# Patient Record
Sex: Male | Born: 1980 | Race: Black or African American | Hispanic: No | Marital: Single | Smoking: Never smoker
Health system: Southern US, Community
[De-identification: ages and names within clinical notes are randomized; demographics above are authoritative.]

## PROBLEM LIST (undated history)

## (undated) DIAGNOSIS — Z87442 Personal history of urinary calculi: Secondary | ICD-10-CM

## (undated) DIAGNOSIS — G4733 Obstructive sleep apnea (adult) (pediatric): Secondary | ICD-10-CM

## (undated) DIAGNOSIS — K219 Gastro-esophageal reflux disease without esophagitis: Secondary | ICD-10-CM

## (undated) DIAGNOSIS — D869 Sarcoidosis, unspecified: Secondary | ICD-10-CM

## (undated) DIAGNOSIS — E785 Hyperlipidemia, unspecified: Secondary | ICD-10-CM

## (undated) DIAGNOSIS — R030 Elevated blood-pressure reading, without diagnosis of hypertension: Secondary | ICD-10-CM

## (undated) DIAGNOSIS — J324 Chronic pansinusitis: Secondary | ICD-10-CM

## (undated) DIAGNOSIS — J849 Interstitial pulmonary disease, unspecified: Secondary | ICD-10-CM

## (undated) DIAGNOSIS — R7303 Prediabetes: Secondary | ICD-10-CM

## (undated) DIAGNOSIS — E790 Hyperuricemia without signs of inflammatory arthritis and tophaceous disease: Secondary | ICD-10-CM

## (undated) DIAGNOSIS — J309 Allergic rhinitis, unspecified: Secondary | ICD-10-CM

## (undated) DIAGNOSIS — R011 Cardiac murmur, unspecified: Secondary | ICD-10-CM

## (undated) DIAGNOSIS — R590 Localized enlarged lymph nodes: Secondary | ICD-10-CM

## (undated) DIAGNOSIS — R079 Chest pain, unspecified: Secondary | ICD-10-CM

## (undated) HISTORY — DX: Interstitial pulmonary disease, unspecified: J84.9

## (undated) HISTORY — PX: LUNG BIOPSY: SHX232

## (undated) HISTORY — DX: Hyperlipidemia, unspecified: E78.5

## (undated) HISTORY — DX: Chronic pansinusitis: J32.4

## (undated) HISTORY — DX: Elevated blood-pressure reading, without diagnosis of hypertension: R03.0

## (undated) HISTORY — DX: Hyperuricemia without signs of inflammatory arthritis and tophaceous disease: E79.0

## (undated) HISTORY — DX: Sarcoidosis, unspecified: D86.9

## (undated) HISTORY — DX: Localized enlarged lymph nodes: R59.0

## (undated) HISTORY — DX: Morbid (severe) obesity due to excess calories: E66.01

## (undated) HISTORY — PX: NO PAST SURGERIES: SHX2092

## (undated) HISTORY — DX: Obstructive sleep apnea (adult) (pediatric): G47.33

## (undated) HISTORY — DX: Allergic rhinitis, unspecified: J30.9

## (undated) HISTORY — DX: Prediabetes: R73.03

## (undated) HISTORY — DX: Chest pain, unspecified: R07.9

---

## 2017-09-18 ENCOUNTER — Emergency Department (HOSPITAL_COMMUNITY)
Admission: EM | Admit: 2017-09-18 | Discharge: 2017-09-18 | Disposition: A | Discharge status: Home / Self Care | Payer: Self-pay | Attending: Emergency Medicine | Admitting: Emergency Medicine

## 2017-09-18 ENCOUNTER — Other Ambulatory Visit: Payer: Self-pay

## 2017-09-18 ENCOUNTER — Encounter (HOSPITAL_COMMUNITY): Payer: Self-pay

## 2017-09-18 ENCOUNTER — Emergency Department (HOSPITAL_COMMUNITY): Payer: Self-pay

## 2017-09-18 DIAGNOSIS — B9789 Other viral agents as the cause of diseases classified elsewhere: Secondary | ICD-10-CM

## 2017-09-18 DIAGNOSIS — J069 Acute upper respiratory infection, unspecified: Secondary | ICD-10-CM | POA: Insufficient documentation

## 2017-09-18 MED ORDER — FLUTICASONE PROPIONATE 50 MCG/ACT NA SUSP: 2.0000 | Freq: Every day | NASAL | 0 refills | Status: DC

## 2017-09-18 MED ORDER — LORATADINE 10 MG PO TABS: 10.0000 mg | ORAL_TABLET | Freq: Every day | ORAL | 0 refills | Status: DC

## 2017-09-18 MED ORDER — ALBUTEROL SULFATE HFA 108 (90 BASE) MCG/ACT IN AERS
2.0000 | INHALATION_SPRAY | Freq: Once | RESPIRATORY_TRACT | Status: AC
  Administered 2017-09-18: 2 via RESPIRATORY_TRACT
  Filled 2017-09-18: qty 6.7

## 2017-09-18 NOTE — ED Provider Notes (Signed)
[facility] EMERGENCY DEPARTMENT Provider Note   CSN: [id number] Arrival date & time: 09/18/17  1024     History   Chief Complaint Chief Complaint  Patient presents with  . Cough    HPI Paul Thomas is a 37 y.o. male.  HPI   Pt is a 37 y.o male who presents to the ED today c/o a cough that has been ongoing for about 2 months. Cough has been productive of yellow sputum.  He reports nasal congestion and post nasal drip. Denies rhinorrhea or ear pain.  He reports intermittent sore throat, denies current sore throat. He denies fevers, chest pain, shortness of breath. States that in the morning he has hoarse voice intermittently over the last few months as well which improves after he coughs up sputum. Has tried taking mucinex and claritin without relief. Denies tobacco use.   History reviewed. No pertinent past medical history.  There are no active problems to display for this patient.   History reviewed. No pertinent surgical history.      Home Medications    Prior to Admission medications   Medication Sig Start Date End Date Taking? Authorizing Provider  fluticasone (FLONASE) 50 MCG/ACT nasal spray Place 2 sprays into both nostrils daily. 09/18/17   Donnavan Covault S, PA-C  loratadine (CLARITIN) 10 MG tablet Take 1 tablet (10 mg total) by mouth daily. 09/18/17   Verlyn Lambert S, PA-C    Family History History reviewed. No pertinent family history.  Social History Social History   Tobacco Use  . Smoking status: Never Smoker  Substance Use Topics  . Alcohol use: Yes    Comment: occ  . Drug use: Never     Allergies   Patient has no known allergies.   Review of Systems Review of Systems  Constitutional: Negative for fever.  HENT: Positive for congestion, postnasal drip, sinus pressure and voice change. Negative for ear pain, rhinorrhea, sinus pain, sore throat and trouble swallowing.   Eyes: Negative for visual disturbance.    Respiratory: Positive for cough. Negative for shortness of breath and wheezing.   Cardiovascular: Negative for chest pain.  Gastrointestinal: Negative for abdominal pain, diarrhea, nausea and vomiting.  Genitourinary: Negative for decreased urine volume and frequency.  Musculoskeletal: Negative for back pain and neck pain.  Skin: Negative for rash.  Neurological: Negative for headaches.     Physical Exam Updated Vital Signs BP 135/89   Pulse 78   Temp 98.3 F (36.8 C) (Oral)   Resp 16   Ht 6\' 1"  (1.854 m)   Wt (!) 147.4 kg (325 lb)   SpO2 98%   BMI 42.88 kg/m   Physical Exam  Constitutional: He appears well-developed and well-nourished.  HENT:  Head: Normocephalic and atraumatic.  Right Ear: External ear normal.  Left Ear: External ear normal.  Mouth/Throat: Oropharynx is clear and moist.  Bilateral TMs without erythema or effusion.  Uvula midline.  No pharyngeal erythema or tonsillar swelling.  No tonsillar exudates.  Patient has mildly hoarse voice, however no hot potato voice.  No evidence of PTA or retropharyngeal abscess.  Eyes: Pupils are equal, round, and reactive to light. Conjunctivae and EOM are normal.  Neck: Normal range of motion. Neck supple.  Cardiovascular: Normal rate, regular rhythm, normal heart sounds and intact distal pulses.  No murmur heard. Pulmonary/Chest: Effort normal and breath sounds normal. No stridor. No respiratory distress. He has no wheezes.  Abdominal: Soft. Bowel sounds are normal. There is no  tenderness.  Musculoskeletal: He exhibits no edema.  Lymphadenopathy:    He has no cervical adenopathy.  Neurological: He is alert.  Skin: Skin is warm and dry.  Psychiatric: He has a normal mood and affect.  Nursing note and vitals reviewed.    ED Treatments / Results  Labs (all labs ordered are listed, but only abnormal results are displayed) Labs Reviewed - No data to display  EKG None  Radiology Dg Chest 2 View  Result Date:  09/18/2017 CLINICAL DATA:  Intermittent cough over the last 2 months. EXAM: CHEST - 2 VIEW COMPARISON:  None. FINDINGS: Borderline cardiomegaly. Question hilar and paratracheal prominence that could go along with adenopathy. Centrally prominent lung markings which could indicate bronchitis or other processes such as sarcoid lung disease. No effusions. Bony structures unremarkable. IMPRESSION: Central bronchial thickening. Possible hilar and mediastinal adenopathy. The differential diagnosis is inflammatory disease versus sarcoid. Electronically Signed   By: Paulina FusiMark  Shogry M.D.   On: 09/18/2017 11:31    Procedures Procedures (including critical care time)  Medications Ordered in ED Medications  albuterol (PROVENTIL HFA;VENTOLIN HFA) 108 (90 Base) MCG/ACT inhaler 2 puff (2 puffs Inhalation Given 09/18/17 1251)     Initial Impression / Assessment and Plan / ED Course  I have reviewed the triage vital signs and the nursing notes.  Pertinent labs & imaging results that were available during my care of the patient were reviewed by me and considered in my medical decision making (see chart for details).      Final Clinical Impressions(s) / ED Diagnoses   Final diagnoses:  Viral URI with cough   Pt CXR negative for acute infiltrate. Patients symptoms are consistent with viral URI vs seasonal allergies. Exam nonconcerning for PTA or retropharyngeal abscess. Discussed that antibiotics are not indicated for viral infections. Pt will be discharged with symptomatic treatment. Discussed results of cxr and that pt needs to follow up with pulmonology for further evaluation. Pt hemodynamically stable. Verbalizes understanding and is agreeable with plan. Pt is hemodynamically stable & in NAD prior to dc.   ED Discharge Orders        Ordered    fluticasone (FLONASE) 50 MCG/ACT nasal spray  Daily     09/18/17 1239    loratadine (CLARITIN) 10 MG tablet  Daily     09/18/17 1239       Lorea Kupfer S,  PA-C 09/18/17 1254    Margarita Grizzleay, Danielle, MD 09/18/17 1708

## 2017-09-18 NOTE — ED Notes (Signed)
Patient returned from xray.

## 2017-09-18 NOTE — Discharge Instructions (Signed)
You were given a prescription for Claritin, Flonase, and an albuterol inhaler for your symptoms.  Make sure that you are staying well-hydrated over the next few weeks.  You should follow-up with the pulmonologist for further treatment of your symptoms.  You should return to the emergency department for any fevers, shortness of breath, chest pain, or any new or worsening symptoms.

## 2017-09-18 NOTE — ED Notes (Signed)
Patient transported to X-ray 

## 2017-09-18 NOTE — ED Triage Notes (Signed)
Pt endorses intermittent cough/hoarseness x 2 months, denies fever shob or cp. Occasional green sputum. VSS

## 2017-09-25 ENCOUNTER — Encounter: Payer: Self-pay | Admitting: Pulmonary Disease

## 2017-09-25 ENCOUNTER — Ambulatory Visit (INDEPENDENT_AMBULATORY_CARE_PROVIDER_SITE_OTHER): Payer: Self-pay | Admitting: Pulmonary Disease

## 2017-09-25 VITALS — BP 130/84 | HR 74 | Wt 321.0 lb

## 2017-09-25 DIAGNOSIS — R05 Cough: Secondary | ICD-10-CM

## 2017-09-25 DIAGNOSIS — R059 Cough, unspecified: Secondary | ICD-10-CM

## 2017-09-25 DIAGNOSIS — K219 Gastro-esophageal reflux disease without esophagitis: Secondary | ICD-10-CM

## 2017-09-25 DIAGNOSIS — R590 Localized enlarged lymph nodes: Secondary | ICD-10-CM

## 2017-09-25 DIAGNOSIS — J301 Allergic rhinitis due to pollen: Secondary | ICD-10-CM

## 2017-09-25 MED ORDER — DOXYCYCLINE HYCLATE 100 MG PO TABS: 100.0000 mg | ORAL_TABLET | Freq: Two times a day (BID) | ORAL | 0 refills | Status: DC

## 2017-09-25 NOTE — Patient Instructions (Signed)
Abnormal chest x-ray with lymph node enlargement: We will arrange for a CT scan of your chest If the CT scan shows abnormalities then we will likely need to perform a bronchoscopy and will call you to arrange this.  Allergic rhinitis: Take Claritin daily Take Flonase 2 sprays each nostril daily  Gastroesophageal reflux disease: Take over-the-counter Prilosec daily for the next month  Acute bronchitis: Take the doxycycline 100 mg twice a day times 5 days  Follow up in 3-4 weeks or sooner if needed

## 2017-09-25 NOTE — Progress Notes (Signed)
Subjective:   PATIENT ID: Paul Thomas GENDER: male DOB: [birth year], MRN: [id number]  Synopsis: Referred in March 2019 for evaluation of an abnormal CXR that showed lymph node involvement  HPI  Chief Complaint  Patient presents with  . Hospitalization Follow-up    produtive cough with brown mucous, dx with bronchitit    Mr. Paul Thomas is a pleasant 37 year old male with no past medical history who is never smoked cigarettes who comes to my clinic today for evaluation of an abnormal chest x-ray and bronchitis.  He says it for the last 2 months he has been coughing up yellow to brown mucus and has hoarseness.  This is also associated with a scratchy sensation in his throat.  He has sinus congestion and postnasal drip which he only recently started treating with Claritin and Flonase.  He says he also has acid reflux and he only takes Prilosec on an as-needed basis.  He denies any shortness of breath fevers chills or weight loss.  He says that his symptoms have been persistent for the last 2 months and he continues to cough up brown mucus now.  He was seen in the emergency room a week ago and was told that he had an abnormal chest x-ray so a referral was made to our clinic for further evaluation.  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  He has no childhood history of respiratory illnesses and no family history of lung disease.  History reviewed. No pertinent past medical history.   Family History  Problem Relation Age of Onset  . Heart attack Father      Social History   Socioeconomic History  . Marital status: Single    Spouse name: Not on file  . Number of children: Not on file  . Years of education: Not on file  . Highest education level: Not on file  Occupational History  . Not on file  Social Needs  . Financial resource strain: Not on file  . Food insecurity:    Worry: Not on file    Inability:  Not on file  . Transportation needs:    Medical: Not on file    Non-medical: Not on file  Tobacco Use  . Smoking status: Never Smoker  Substance and Sexual Activity  . Alcohol use: Not Currently    Frequency: Never    Comment: occ  . Drug use: Never  . Sexual activity: Not on file  Lifestyle  . Physical activity:    Days per week: Not on file    Minutes per session: Not on file  . Stress: Not on file  Relationships  . Social connections:    Talks on phone: Not on file    Gets together: Not on file    Attends religious service: Not on file    Active member of club or organization: Not on file    Attends meetings of clubs or organizations: Not on file    Relationship status: Not on file  . Intimate partner violence:    Fear of current or ex partner: Not on file    Emotionally abused: Not on file    Physically abused: Not on file    Forced sexual activity: Not on file  Other Topics Concern  . Not on file  Social History Narrative  . Not on file     No Known Allergies  Outpatient Medications Prior to Visit  Medication Sig Dispense Refill  . fluticasone (FLONASE) 50 MCG/ACT nasal spray Place 2 sprays into both nostrils daily. 16 Thomas 0  . loratadine (CLARITIN) 10 MG tablet Take 1 tablet (10 mg total) by mouth daily. 20 tablet 0   No facility-administered medications prior to visit.     Review of Systems  Constitutional: Negative for chills, fever, malaise/fatigue and weight loss.  HENT: Positive for congestion. Negative for nosebleeds, sinus pain and sore throat.   Eyes: Negative for photophobia, pain and discharge.  Respiratory: Positive for cough. Negative for hemoptysis, sputum production, shortness of breath and wheezing.   Cardiovascular: Negative for chest pain, palpitations, orthopnea and leg swelling.  Gastrointestinal: Negative for abdominal pain, constipation, diarrhea, nausea and vomiting.  Genitourinary: Negative for dysuria, frequency, hematuria and urgency.   Musculoskeletal: Negative for back pain, joint pain, myalgias and neck pain.  Skin: Negative for itching and rash.  Neurological: Negative for tingling, tremors, sensory change, speech change, focal weakness, seizures, weakness and headaches.  Psychiatric/Behavioral: Negative for memory loss, substance abuse and suicidal ideas. The patient is not nervous/anxious.       Objective:  Physical Exam   Vitals:   09/25/17 1034  BP: 130/84  Pulse: 74  SpO2: 97%  Weight: (!) 321 lb (145.6 kg)    Gen: well appearing, no acute distress HENT: NCAT, OP clear, neck supple without masses Eyes: PERRL, EOMi Lymph: no cervical lymphadenopathy PULM: Few crackles lower lobes B CV: RRR, no mgr, no JVD GI: BS+, soft, nontender, no hsm Derm: no rash or skin breakdown MSK: normal bulk and tone Neuro: A&Ox4, CN II-XII intact, strength 5/5 in all 4 extremities Psyche: normal mood and affect  CBC No results found for: WBC, RBC, HGB, HCT, PLT, MCV, MCH, MCHC, RDW, LYMPHSABS, MONOABS, EOSABS, BASOSABS   Chest imaging:  March 2019 chest x-ray images independently reviewed showing lymphadenopathy bilaterally  PFT:  Labs:  Path:  Echo:  Heart Catheterization:  Records from his ER visit were reviewed where he was seen for cough.     Assessment & Plan:   Cough  Localized enlarged lymph nodes - Plan: CT Chest Wo Contrast  Allergic rhinitis due to pollen, unspecified seasonality  Gastroesophageal reflux disease, esophagitis presence not specified  Discussion: Mr. Paul Thomas presents with cough and mucus production for 2 months.  He has poorly controlled allergic rhinitis as well as acid reflux.  While these are likely the cause of his cough he was found incidentally to have lymph node enlargement on his chest x-ray.  I explained to him today that the differential diagnosis here is broad and includes atypical infection, sarcoidosis, less likely lymphoma.  He does not have any signs or  symptoms of an active malignancy.  However, we cannot ignore the findings on the chest x-ray so we need to assess further with a CT scan and then likely biopsy.  I explained all this to him today at length.  Plan: Abnormal chest x-ray with lymph node enlargement: We will arrange for a CT scan of your chest If the CT scan shows abnormalities then we will likely need to perform a bronchoscopy and will call you to arrange this.  Allergic rhinitis: Take Claritin daily Take Flonase 2 sprays each nostril daily  Gastroesophageal reflux disease: Take over-the-counter Prilosec daily for the next month  Acute bronchitis: Take the doxycycline 100 mg twice a day times 5 days  Follow up in 3-4 weeks or sooner if needed  Current Outpatient Medications:  .  doxycycline (VIBRA-TABS) 100 MG tablet, Take 1 tablet (100 mg total) by mouth 2 (two) times daily., Disp: 10 tablet, Rfl: 0 .  fluticasone (FLONASE) 50 MCG/ACT nasal spray, Place 2 sprays into both nostrils daily., Disp: 16 Thomas, Rfl: 0 .  loratadine (CLARITIN) 10 MG tablet, Take 1 tablet (10 mg total) by mouth daily., Disp: 20 tablet, Rfl: 0

## 2017-10-02 ENCOUNTER — Ambulatory Visit (INDEPENDENT_AMBULATORY_CARE_PROVIDER_SITE_OTHER)
Admission: RE | Admit: 2017-10-02 | Discharge: 2017-10-02 | Disposition: A | Discharge status: Home / Self Care | Payer: Self-pay | Source: Ambulatory Visit | Attending: Pulmonary Disease | Admitting: Pulmonary Disease

## 2017-10-02 DIAGNOSIS — R590 Localized enlarged lymph nodes: Secondary | ICD-10-CM

## 2017-10-09 ENCOUNTER — Telehealth: Payer: Self-pay | Admitting: Pulmonary Disease

## 2017-10-09 DIAGNOSIS — R599 Enlarged lymph nodes, unspecified: Secondary | ICD-10-CM

## 2017-10-09 NOTE — Telephone Encounter (Signed)
Pt called back. Wants to know about a possible procedure to be sch.

## 2017-10-09 NOTE — Telephone Encounter (Signed)
Called patient, unable to reach left message to give us a call back. 

## 2017-10-09 NOTE — Telephone Encounter (Signed)
Called and spoke with pt who wanted to know if the bronch had been scheduled for pt.  Listed in pt's CT Super D Chest without contrast, BQ stated for pt to have an EBUS and per Morrie SheldonAshley, [facility].  Stated to pt [facility] would [facility] to see if she has an update on us for when EBUS could be scheduled.

## 2017-10-10 ENCOUNTER — Telehealth: Payer: Self-pay | Admitting: Pulmonary Disease

## 2017-10-10 MED ORDER — DOXYCYCLINE HYCLATE 100 MG PO TABS: 100.0000 mg | ORAL_TABLET | Freq: Two times a day (BID) | ORAL | 0 refills | Status: DC

## 2017-10-10 NOTE — Telephone Encounter (Signed)
Doxycycline 100mg po bid x 5 days 

## 2017-10-10 NOTE — Telephone Encounter (Signed)
Paul Thomas please follow up on this, thank you.

## 2017-10-10 NOTE — Telephone Encounter (Signed)
Spoke with pt. States that his cough has returned since stopping Doxycycline. Reports that the cough is non productive. Denies chest tightness, wheezing, SOB or fever. Pt is scheduled for a bronch on 10/23/17. Pt would like a refill on Doxycycline.  Dr. Kendrick FriesMcQuaid - please advise. Thanks.

## 2017-10-10 NOTE — Telephone Encounter (Signed)
Spoke with pt. He is aware of Dr. McQuaid's recommendation. Rx has been sent in. Nothing further was needed. 

## 2017-10-10 NOTE — Addendum Note (Signed)
Addended by: Velvet BatheAULFIELD, ASHLEY L on: 10/10/2017 10:03 AM   Modules accepted: Orders

## 2017-10-10 NOTE — Telephone Encounter (Signed)
I need an order and some dates dr Kendrick Friesmcquaid might could do this

## 2017-10-10 NOTE — Telephone Encounter (Signed)
BQ is available on the following dates:   Tuesday, Wed, or [place] [facility] in the afternoon 4/30, 5/1, 5/2. ------ Order placed.  Routing back to [facility] for scheduling.

## 2017-10-11 ENCOUNTER — Other Ambulatory Visit: Payer: Self-pay

## 2017-10-11 ENCOUNTER — Encounter (HOSPITAL_COMMUNITY)
Admission: RE | Admit: 2017-10-11 | Discharge: 2017-10-11 | Disposition: A | Discharge status: Home / Self Care | Payer: Self-pay | Source: Ambulatory Visit | Attending: Pulmonary Disease | Admitting: Pulmonary Disease

## 2017-10-11 ENCOUNTER — Encounter (HOSPITAL_COMMUNITY): Payer: Self-pay

## 2017-10-11 ENCOUNTER — Telehealth: Payer: Self-pay | Admitting: Pulmonary Disease

## 2017-10-11 DIAGNOSIS — R590 Localized enlarged lymph nodes: Secondary | ICD-10-CM | POA: Insufficient documentation

## 2017-10-11 DIAGNOSIS — Z01812 Encounter for preprocedural laboratory examination: Secondary | ICD-10-CM | POA: Insufficient documentation

## 2017-10-11 HISTORY — DX: Personal history of urinary calculi: Z87.442

## 2017-10-11 HISTORY — DX: Cardiac murmur, unspecified: R01.1

## 2017-10-11 HISTORY — DX: Gastro-esophageal reflux disease without esophagitis: K21.9

## 2017-10-11 LAB — COMPREHENSIVE METABOLIC PANEL
ALT: 46 U/L (ref 17–63)
AST: 40 U/L (ref 15–41)
Albumin: 3.5 g/dL (ref 3.5–5.0)
Alkaline Phosphatase: 97 U/L (ref 38–126)
Anion gap: 11 (ref 5–15)
BILIRUBIN TOTAL: 0.7 mg/dL (ref 0.3–1.2)
BUN: 11 mg/dL (ref 6–20)
CO2: 24 mmol/L (ref 22–32)
Calcium: 8.9 mg/dL (ref 8.9–10.3)
Chloride: 100 mmol/L — ABNORMAL LOW (ref 101–111)
Creatinine, Ser: 1.11 mg/dL (ref 0.61–1.24)
GFR calc Af Amer: >60 mL/min (ref >60)
Glucose, Bld: 117 mg/dL — ABNORMAL HIGH (ref 65–99)
POTASSIUM: 3.8 mmol/L (ref 3.5–5.1)
Sodium: 135 mmol/L (ref 135–145)
TOTAL PROTEIN: 8.5 g/dL — AB (ref 6.5–8.1)

## 2017-10-11 LAB — PROTIME-INR
INR: 1.08
Prothrombin Time: 13.9 seconds (ref 11.4–15.2)

## 2017-10-11 LAB — CBC
HEMATOCRIT: 41.1 % (ref 39.0–52.0)
HEMOGLOBIN: 13.3 g/dL (ref 13.0–17.0)
MCH: 26.9 pg (ref 26.0–34.0)
MCHC: 32.4 g/dL (ref 30.0–36.0)
MCV: 83 fL (ref 78.0–100.0)
Platelets: 358 10*3/uL (ref 150–400)
RBC: 4.95 MIL/uL (ref 4.22–5.81)
RDW: 14.7 % (ref 11.5–15.5)
WBC: 5.1 10*3/uL (ref 4.0–10.5)

## 2017-10-11 LAB — APTT: APTT: 31 s (ref 24–36)

## 2017-10-11 NOTE — Telephone Encounter (Signed)
Called and spoke with patient. He stated that he had googled the procedure and wanted to know how invasive the procedure would be. I explained the steps of the procedure and patient stated he felt better. Nothing further needed.

## 2017-10-11 NOTE — Pre-Procedure Instructions (Signed)
Paul Thomas  10/11/2017      [id number]- [place], [place] - [address] [place] [id number] Phone: [phone] Fax: [phone]    Your procedure is scheduled on  Tuesday 10/23/17  [facility] Admitting at [employer]1130 A.M.  Call this number if you have problems the morning of surgery:  [phone]   Remember:  Do not eat food or drink liquids after midnight.  Take these medicines the morning of surgery with A SIP OF WATER - flonase, omeprazole (prilosec)  7 days prior to surgery STOP taking any Aspirin(unless otherwise instructed by your surgeon), Aleve, Naproxen, Ibuprofen, Motrin, Advil, Goody's, BC's, all herbal medications, fish oil, and all vitamins    Do not wear jewelry, make-up or nail polish.  Do not wear lotions, powders, or perfumes, or deodorant.  Do not shave 48 hours prior to surgery.  Men may shave face and neck.  Do not bring valuables to the hospital.  [facility] is not responsible for any belongings or valuables.  Contacts, dentures or bridgework may not be worn into surgery.  Leave your suitcase in the car.  After surgery it may be brought to your room.  For patients admitted to the hospital, discharge time will be determined by your treatment team.  Patients discharged the day of surgery will not be allowed to drive home.   Name and phone number of your driver:    Special instructions:  [name] - Preparing for Surgery  Before surgery, you can play an important role.  Because skin is not sterile, your skin needs to be as free of germs as possible.  You can reduce the number of germs on you skin by washing with CHG (chlorahexidine gluconate) soap before surgery.  CHG is an antiseptic cleaner which kills germs and bonds with the skin to continue killing germs even after washing.  Please DO NOT use if you have an allergy to CHG or antibacterial soaps.  If your skin becomes  reddened/irritated stop using the CHG and inform your nurse when you arrive at Short Stay.  Do not shave (including legs and underarms) for at least 48 hours prior to the first CHG shower.  You may shave your face.  Please follow these instructions carefully:   1.  Shower with CHG Soap the night before surgery and the                                morning of Surgery.  2.  If you choose to wash your hair, wash your hair first as usual with your       normal shampoo.  3.  After you shampoo, rinse your hair and body thoroughly to remove the                      Shampoo.  4.  Use CHG as you would any other liquid soap.  You can apply chg directly       to the skin and wash gently with scrungie or a clean washcloth.  5.  Apply the CHG Soap to your body ONLY FROM THE NECK DOWN.        Do not use on open wounds or open sores.  Avoid contact with your eyes,       ears, mouth and genitals (private parts).  Wash genitals (private parts)  with your normal soap.  6.  Wash thoroughly, paying special attention to the area where your surgery        will be performed.  7.  Thoroughly rinse your body with warm water from the neck down.  8.  DO NOT shower/wash with your normal soap after using and rinsing off       the CHG Soap.  9.  Pat yourself dry with a clean towel.            10.  Wear clean pajamas.            11.  Place clean sheets on your bed the night of your first shower and do not        sleep with pets.  Day of Surgery  Do not apply any lotions/deoderants the morning of surgery.  Please wear clean clothes to the hospital/surgery center.     Please read over the following fact sheets that you were given. Pain Booklet

## 2017-10-11 NOTE — Progress Notes (Signed)
   10/11/17 0825  OBSTRUCTIVE SLEEP APNEA  Have you ever been diagnosed with sleep apnea through a sleep study? No  Do you snore loudly (loud enough to be heard through closed doors)?  1  Do you often feel tired, fatigued, or sleepy during the daytime (such as falling asleep during driving or talking to someone)? 0  Has anyone observed you stop breathing during your sleep? 1  Do you have, or are you being treated for high blood pressure? 0  BMI more than 35 kg/m2? 1  Age > 50 (1-yes) 0  Neck circumference greater than:Male 16 inches or larger, Male 17inches or larger? 1  Male Gender (Yes=1) 1  Obstructive Sleep Apnea Score 5  Score 5 or greater  Results sent to PCP

## 2017-10-23 ENCOUNTER — Ambulatory Visit (HOSPITAL_COMMUNITY): Payer: Self-pay | Admitting: Vascular Surgery

## 2017-10-23 ENCOUNTER — Encounter (HOSPITAL_COMMUNITY)
Admission: RE | Disposition: A | Discharge status: Home / Self Care | Payer: Self-pay | Source: Ambulatory Visit | Attending: Pulmonary Disease

## 2017-10-23 ENCOUNTER — Encounter (HOSPITAL_COMMUNITY): Payer: Self-pay | Admitting: *Deleted

## 2017-10-23 ENCOUNTER — Ambulatory Visit (HOSPITAL_COMMUNITY): Payer: Self-pay | Admitting: Certified Registered"

## 2017-10-23 ENCOUNTER — Ambulatory Visit: Payer: Self-pay | Admitting: Adult Health

## 2017-10-23 ENCOUNTER — Ambulatory Visit (HOSPITAL_COMMUNITY)
Admission: RE | Admit: 2017-10-23 | Discharge: 2017-10-23 | Disposition: A | Discharge status: Home / Self Care | Payer: Self-pay | Source: Ambulatory Visit | Attending: Pulmonary Disease | Admitting: Pulmonary Disease

## 2017-10-23 ENCOUNTER — Ambulatory Visit (HOSPITAL_COMMUNITY): Payer: Self-pay

## 2017-10-23 DIAGNOSIS — R590 Localized enlarged lymph nodes: Secondary | ICD-10-CM

## 2017-10-23 DIAGNOSIS — Z79899 Other long term (current) drug therapy: Secondary | ICD-10-CM | POA: Insufficient documentation

## 2017-10-23 DIAGNOSIS — Z9889 Other specified postprocedural states: Secondary | ICD-10-CM

## 2017-10-23 DIAGNOSIS — Z8249 Family history of ischemic heart disease and other diseases of the circulatory system: Secondary | ICD-10-CM | POA: Insufficient documentation

## 2017-10-23 DIAGNOSIS — Z419 Encounter for procedure for purposes other than remedying health state, unspecified: Secondary | ICD-10-CM

## 2017-10-23 DIAGNOSIS — J8410 Pulmonary fibrosis, unspecified: Secondary | ICD-10-CM | POA: Insufficient documentation

## 2017-10-23 DIAGNOSIS — J849 Interstitial pulmonary disease, unspecified: Secondary | ICD-10-CM

## 2017-10-23 DIAGNOSIS — K219 Gastro-esophageal reflux disease without esophagitis: Secondary | ICD-10-CM | POA: Insufficient documentation

## 2017-10-23 HISTORY — PX: VIDEO BRONCHOSCOPY WITH ENDOBRONCHIAL ULTRASOUND: SHX6177

## 2017-10-23 LAB — BODY FLUID CELL COUNT WITH DIFFERENTIAL
EOS FL: 1 %
Lymphs, Fluid: 26 %
Monocyte-Macrophage-Serous Fluid: 4 % — ABNORMAL LOW (ref 50–90)
Neutrophil Count, Fluid: 69 % — ABNORMAL HIGH (ref 0–25)
WBC FLUID: 114 uL (ref 0–1000)

## 2017-10-23 SURGERY — BRONCHOSCOPY, WITH EBUS
Anesthesia: General | Site: Chest

## 2017-10-23 MED ORDER — FENTANYL CITRATE (PF) 250 MCG/5ML IJ SOLN
INTRAMUSCULAR | Status: DC | PRN
  Administered 2017-10-23: 150 ug via INTRAVENOUS
  Administered 2017-10-23: 50 ug via INTRAVENOUS

## 2017-10-23 MED ORDER — PHENYLEPHRINE 40 MCG/ML (10ML) SYRINGE FOR IV PUSH (FOR BLOOD PRESSURE SUPPORT)
PREFILLED_SYRINGE | INTRAVENOUS | Status: DC | PRN
  Administered 2017-10-23 (×3): 80 ug via INTRAVENOUS

## 2017-10-23 MED ORDER — SUCCINYLCHOLINE CHLORIDE 200 MG/10ML IV SOSY
PREFILLED_SYRINGE | INTRAVENOUS | Status: DC | PRN
  Administered 2017-10-23: 120 mg via INTRAVENOUS

## 2017-10-23 MED ORDER — GLYCOPYRROLATE 0.2 MG/ML IV SOSY
PREFILLED_SYRINGE | INTRAVENOUS | Status: DC | PRN
  Administered 2017-10-23: .2 mg via INTRAVENOUS

## 2017-10-23 MED ORDER — FENTANYL CITRATE (PF) 250 MCG/5ML IJ SOLN
INTRAMUSCULAR | Status: AC
  Filled 2017-10-23: qty 5

## 2017-10-23 MED ORDER — PROPOFOL 10 MG/ML IV BOLUS
INTRAVENOUS | Status: DC | PRN
  Administered 2017-10-23: 40 mg via INTRAVENOUS
  Administered 2017-10-23: 200 mg via INTRAVENOUS

## 2017-10-23 MED ORDER — PROPOFOL 500 MG/50ML IV EMUL
INTRAVENOUS | Status: DC | PRN
  Administered 2017-10-23: 200 ug/kg/min via INTRAVENOUS
  Administered 2017-10-23: 14:00:00 via INTRAVENOUS

## 2017-10-23 MED ORDER — MIDAZOLAM HCL 2 MG/2ML IJ SOLN
INTRAMUSCULAR | Status: AC
  Filled 2017-10-23: qty 2

## 2017-10-23 MED ORDER — 0.9 % SODIUM CHLORIDE (POUR BTL) OPTIME
TOPICAL | Status: DC | PRN
  Administered 2017-10-23: 1000 mL

## 2017-10-23 MED ORDER — SUGAMMADEX SODIUM 200 MG/2ML IV SOLN
INTRAVENOUS | Status: DC | PRN
  Administered 2017-10-23: 300 mg via INTRAVENOUS

## 2017-10-23 MED ORDER — MIDAZOLAM HCL 5 MG/5ML IJ SOLN
INTRAMUSCULAR | Status: DC | PRN
  Administered 2017-10-23: 2 mg via INTRAVENOUS

## 2017-10-23 MED ORDER — LACTATED RINGERS IV SOLN
INTRAVENOUS | Status: DC
  Administered 2017-10-23 (×2): via INTRAVENOUS

## 2017-10-23 MED ORDER — DEXMEDETOMIDINE HCL IN NACL 200 MCG/50ML IV SOLN
INTRAVENOUS | Status: DC | PRN
  Administered 2017-10-23: 20 ug via INTRAVENOUS

## 2017-10-23 MED ORDER — DEXAMETHASONE SODIUM PHOSPHATE 10 MG/ML IJ SOLN
INTRAMUSCULAR | Status: DC | PRN
  Administered 2017-10-23: 10 mg via INTRAVENOUS

## 2017-10-23 MED ORDER — LIDOCAINE 2% (20 MG/ML) 5 ML SYRINGE
INTRAMUSCULAR | Status: DC | PRN
  Administered 2017-10-23: 100 mg via INTRAVENOUS

## 2017-10-23 MED ORDER — ALBUTEROL SULFATE HFA 108 (90 BASE) MCG/ACT IN AERS
INHALATION_SPRAY | RESPIRATORY_TRACT | Status: DC | PRN
  Administered 2017-10-23: 8 via RESPIRATORY_TRACT

## 2017-10-23 MED ORDER — ROCURONIUM BROMIDE 10 MG/ML (PF) SYRINGE
PREFILLED_SYRINGE | INTRAVENOUS | Status: DC | PRN
  Administered 2017-10-23 (×2): 10 mg via INTRAVENOUS
  Administered 2017-10-23: 50 mg via INTRAVENOUS

## 2017-10-23 MED ORDER — ONDANSETRON HCL 4 MG/2ML IJ SOLN
INTRAMUSCULAR | Status: DC | PRN
  Administered 2017-10-23: 4 mg via INTRAVENOUS

## 2017-10-23 SURGICAL SUPPLY — 25 items
BRUSH CYTOL CELLEBRITY 1.5X140 (MISCELLANEOUS) IMPLANT
CANISTER SUCT 3000ML PPV (MISCELLANEOUS) ×3 IMPLANT
CONT SPEC 4OZ CLIKSEAL STRL BL (MISCELLANEOUS) ×15 IMPLANT
COVER BACK TABLE 60X90IN (DRAPES) ×3 IMPLANT
COVER DOME SNAP 22 D (MISCELLANEOUS) ×3 IMPLANT
FORCEPS BIOP RJ4 1.8 (CUTTING FORCEPS) ×3 IMPLANT
GAUZE SPONGE 4X4 12PLY STRL (GAUZE/BANDAGES/DRESSINGS) ×3 IMPLANT
GLOVE BIO SURGEON STRL SZ8 (GLOVE) ×3 IMPLANT
GOWN STRL REUS W/ TWL LRG LVL3 (GOWN DISPOSABLE) ×1 IMPLANT
GOWN STRL REUS W/TWL LRG LVL3 (GOWN DISPOSABLE) ×2
KIT CLEAN ENDO COMPLIANCE (KITS) ×6 IMPLANT
KIT TURNOVER KIT B (KITS) ×3 IMPLANT
MARKER SKIN DUAL TIP RULER LAB (MISCELLANEOUS) ×3 IMPLANT
NEEDLE EBUS SONO TIP PENTAX (NEEDLE) ×3 IMPLANT
NS IRRIG 1000ML POUR BTL (IV SOLUTION) ×3 IMPLANT
OIL SILICONE PENTAX (PARTS (SERVICE/REPAIRS)) ×3 IMPLANT
PAD ARMBOARD 7.5X6 YLW CONV (MISCELLANEOUS) ×6 IMPLANT
SYR 20CC LL (SYRINGE) ×3 IMPLANT
SYR 20ML ECCENTRIC (SYRINGE) ×6 IMPLANT
SYR 5ML LUER SLIP (SYRINGE) ×3 IMPLANT
TOWEL OR 17X24 6PK STRL BLUE (TOWEL DISPOSABLE) ×3 IMPLANT
TRAP SPECIMEN MUCOUS 40CC (MISCELLANEOUS) IMPLANT
TUBE CONNECTING 20'X1/4 (TUBING) ×1
TUBE CONNECTING 20X1/4 (TUBING) ×2 IMPLANT
VALVE DISPOSABLE (MISCELLANEOUS) ×6 IMPLANT

## 2017-10-23 NOTE — Op Note (Signed)
Video Bronchoscopy with Endobronchial Ultrasound Procedure Note  Date of Operation: 10/23/2017  Pre-op Diagnosis: Mediastinal lymphadenopathy, diffuse parenchymal lung disease  Post-op Diagnosis: Mediastinal lymphadenopathy, diffuse parenchymal lung disease  Surgeon: Roselie Awkward   Assistants: none  Anesthesia: General endotracheal anesthesia  Operation: Flexible video fiberoptic bronchoscopy with endobronchial ultrasound and biopsies.  Estimated Blood Loss: Minimal  Complications: none immediate  Indications and History: Paul Thomas is a 37 y.o. male with a recent case of bronchitis, he had a CXR performed that showed mediastinal lymphadenopathy.  He came today for a bronchoscopy with biopsy.  The risks, benefits, complications, treatment options and expected outcomes were discussed with the patient.  The possibilities of pneumothorax, pneumonia, reaction to medication, pulmonary aspiration, perforation of a viscus, bleeding, failure to diagnose a condition and creating a complication requiring transfusion or operation were discussed with the patient who freely signed the consent.    Description of Procedure: The patient was examined in the preoperative area and history and data from the preprocedure consultation were reviewed. It was deemed appropriate to proceed.  The patient was taken to OR 10, identified as [name] and the procedure verified as Flexible Video Fiberoptic Bronchoscopy.  A Time Out was held and the above information confirmed. After being taken to the operating room general anesthesia was initiated and the patient  was orally intubated. The video fiberoptic bronchoscope was introduced via the endotracheal tube and a general inspection was performed which showed nodularity throughout the mucosa.  A BAL was performed in the right middle lobe. The standard scope was then withdrawn and the endobronchial ultrasound was used to identify and characterize the  peritracheal, hilar and bronchial lymph nodes. Inspection showed enlarge lymph nodes in nearly every station visible. Using real-time ultrasound guidance Wang needle biopsies were take from Station 10L and 7 nodes and were sent for cytology.  At this point the EBUS scope was removed and the video bronchoscope was re-inserted and transbronchial biopsies were taken from the RUL anterior segment and RLL anterior segments, 4 biopsies from each site.  Then 5 endobronchial biopsies were taken from the subcarina in the RLL. The patient tolerated the procedure well without apparent complications. There was no significant blood loss. The bronchoscope was withdrawn. Anesthesia was reversed and the patient was taken to the PACU for recovery.   Samples: 1. Wang needle biopsies from 10L node 2. Wang needle biopsies from 7 node 3. Transbronchial biopsies from the right upper lobe and right lower lobe, these were submitted as one specimen labeled right upper lobe. 4. Endobronchial biopsies from the right lower lobe. 5. Bronchoalveolar lavage from the right middle lobe: sent for cytology, cell count with diff, bacterial, fungus, AFB culture.  Plans:  The patient will be discharged from the PACU to home when recovered from anesthesia. We will review the cytology, pathology and microbiology results with the patient when they become available. Outpatient followup will be with Dr. Lake Bells.    Roselie Awkward, MD [facility] Pager: [phone] Cell: [phone] After 3pm or if no response, call [phone]  10/23/2017_0 :51 PM

## 2017-10-23 NOTE — Anesthesia Postprocedure Evaluation (Signed)
Anesthesia Post Note  Patient: Paul Thomas  Procedure(s) Performed: VIDEO BRONCHOSCOPY WITH ENDOBRONCHIAL ULTRASOUND (N/A Chest)     Patient location during evaluation: PACU Anesthesia Type: General Level of consciousness: awake Pain management: pain level controlled Vital Signs Assessment: post-procedure vital signs reviewed and stable Respiratory status: spontaneous breathing Cardiovascular status: stable Anesthetic complications: no    Last Vitals:  Vitals:   10/23/17 1522 10/23/17 1530  BP: 114/65   Pulse:  77  Resp:  20  Temp:    SpO2:  93%    Last Pain:  Vitals:   10/23/17 1530  TempSrc:   PainSc: 0-No pain                 Melynda Krzywicki

## 2017-10-23 NOTE — Anesthesia Procedure Notes (Signed)
Procedure Name: Intubation Date/Time: 10/23/2017 1:43 PM Performed by: Elliot Dally, CRNA Pre-anesthesia Checklist: Patient identified, Emergency Drugs available, Suction available and Patient being monitored Patient Re-evaluated:Patient Re-evaluated prior to induction Oxygen Delivery Method: Circle System Utilized Preoxygenation: Pre-oxygenation with 100% oxygen Induction Type: IV induction Ventilation: Oral airway inserted - appropriate to patient size, Two handed mask ventilation required and Mask ventilation without difficulty Laryngoscope Size: Miller and 3 Grade View: Grade I Tube type: Oral Tube size: 8.0 mm Number of attempts: 1 Airway Equipment and Method: Stylet and Oral airway Placement Confirmation: ETT inserted through vocal cords under direct vision,  positive ETCO2 and breath sounds checked- equal and bilateral Secured at: 23 cm Tube secured with: Tape Dental Injury: Teeth and Oropharynx as per pre-operative assessment

## 2017-10-23 NOTE — Anesthesia Preprocedure Evaluation (Signed)
Anesthesia Evaluation  Patient identified by MRN, date of birth, ID band Patient awake    Reviewed: Allergy & Precautions, NPO status , Patient's Chart, lab work & pertinent test results  Airway Mallampati: II  TM Distance: >3 FB     Dental   Pulmonary  History noted. CG   breath sounds clear to auscultation       Cardiovascular  Rhythm:Regular Rate:Normal  History noted CG   Neuro/Psych    GI/Hepatic Neg liver ROS, GERD  ,  Endo/Other  negative endocrine ROS  Renal/GU negative Renal ROS     Musculoskeletal   Abdominal   Peds  Hematology   Anesthesia Other Findings   Reproductive/Obstetrics                             Anesthesia Physical Anesthesia Plan  ASA: III  Anesthesia Plan: General   Post-op Pain Management:    Induction: Intravenous  PONV Risk Score and Plan: 2 and Treatment may vary due to age or medical condition, Ondansetron, Dexamethasone and Midazolam  Airway Management Planned: Oral ETT  Additional Equipment:   Intra-op Plan:   Post-operative Plan: Possible Post-op intubation/ventilation  Informed Consent: I have reviewed the patients History and Physical, chart, labs and discussed the procedure including the risks, benefits and alternatives for the proposed anesthesia with the patient or authorized representative who has indicated his/her understanding and acceptance.   Dental advisory given  Plan Discussed with: CRNA and Anesthesiologist  Anesthesia Plan Comments:         Anesthesia Quick Evaluation

## 2017-10-23 NOTE — H&P (Signed)
[facility]  HPI: Pleasant 37 y/o male referred in 09/2017 after he suffered from bronchitis and was noted to have mediastinal lymphadenopathy on a CXR. He is here today for a bronchoscopy/EBUS.  He has some sinus congestion, but no dyspnea or chest pain.   Past Medical History:  Diagnosis Date  . GERD (gastroesophageal reflux disease)   . Heart murmur     ????when in high school during SPORT physical- never saw a cardiologist (no problem)  . History of kidney stones      Family History  Problem Relation Age of Onset  . Heart attack Father      Social History   Socioeconomic History  . Marital status: Single    Spouse name: Not on file  . Number of children: Not on file  . Years of education: Not on file  . Highest education level: Not on file  Occupational History  . Not on file  Social Needs  . Financial resource strain: Not on file  . Food insecurity:    Worry: Not on file    Inability: Not on file  . Transportation needs:    Medical: Not on file    Non-medical: Not on file  Tobacco Use  . Smoking status: Never Smoker  Substance and Sexual Activity  . Alcohol use: Not Currently    Frequency: Never    Comment: occ  . Drug use: Never  . Sexual activity: Not on file  Lifestyle  . Physical activity:    Days per week: Not on file    Minutes per session: Not on file  . Stress: Not on file  Relationships  . Social connections:    Talks on phone: Not on file    Gets together: Not on file    Attends religious service: Not on file    Active member of club or organization: Not on file    Attends meetings of clubs or organizations: Not on file    Relationship status: Not on file  . Intimate partner violence:    Fear of current or ex partner: Not on file    Emotionally abused: Not on file    Physically abused: Not on file    Forced sexual activity: Not on file  Other Topics Concern  . Not on file  Social History Narrative  . Not on file     No Known Allergies    @ Vitals:   10/23/17 1147  BP: (!) 157/92  Pulse: 80  Resp: 20  Temp: 98.3 F (36.8 C)  TempSrc: Oral  SpO2: 97%  Weight: (!) 314 lb (142.4 kg)   General:  Resting comfortably in bed HENT: NCAT OP clear PULM: CTA B, normal effort CV: RRR, no mgr GI: BS+, soft, nontender MSK: normal bulk and tone Neuro: awake, alert, no distress, MAEW  CBC    Component Value Date/Time   WBC 5.1 10/11/2017 0839   RBC 4.95 10/11/2017 0839   HGB 13.3 10/11/2017 0839   HCT 41.1 10/11/2017 0839   PLT 358 10/11/2017 0839   MCV 83.0 10/11/2017 0839   MCH 26.9 10/11/2017 0839   MCHC 32.4 10/11/2017 0839   RDW 14.7 10/11/2017 0839   10/02/2017 CT chest > extensive mediastinal and hilar lymphadenopathy, some scattered pulmonary nodules  Impression/plan: Mediastinal/hilar adenopathy and pulmonary 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  Plan EBUS for FNA of mediastinal lymphadenopathy and transbronchial biopsy.  Heber Ritchie, MD [facility] Pager: [phone] Cell: [phone] After  3pm or if no response, call [phone]

## 2017-10-23 NOTE — Transfer of Care (Signed)
Immediate Anesthesia Transfer of Care Note  Patient: Paul Thomas  Procedure(s) Performed: VIDEO BRONCHOSCOPY WITH ENDOBRONCHIAL ULTRASOUND (N/A Chest)  Patient Location: PACU  Anesthesia Type:General  Level of Consciousness: drowsy  Airway & Oxygen Therapy: Patient Spontanous Breathing and Patient connected to nasal cannula oxygen  Post-op Assessment: Report given to RN and Post -op Vital signs reviewed and stable  Post vital signs: Reviewed and stable  Last Vitals:  Vitals Value Taken Time  BP 111/65 10/23/2017  3:07 PM  Temp    Pulse 88 10/23/2017  3:08 PM  Resp 25 10/23/2017  3:08 PM  SpO2 96 % 10/23/2017  3:08 PM  Vitals shown include unvalidated device data.  Last Pain:  Vitals:   10/23/17 1147  TempSrc: Oral      Patients Stated Pain Goal: 8 (10/23/17 1222)  Complications: No apparent anesthesia complications

## 2017-10-24 ENCOUNTER — Encounter (HOSPITAL_COMMUNITY): Payer: Self-pay | Admitting: Pulmonary Disease

## 2017-10-24 LAB — ACID FAST SMEAR (AFB): ACID FAST SMEAR - AFSCU2: NEGATIVE

## 2017-10-24 LAB — ACID FAST SMEAR (AFB, MYCOBACTERIA)

## 2017-10-25 ENCOUNTER — Telehealth: Payer: Self-pay | Admitting: Pulmonary Disease

## 2017-10-25 LAB — CULTURE, RESPIRATORY: CULTURE: NORMAL

## 2017-10-25 LAB — CULTURE, RESPIRATORY W GRAM STAIN

## 2017-10-25 NOTE — Telephone Encounter (Signed)
I called Mr. 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  Will need to await results of the cultures.  He voiced understanding.

## 2017-10-28 LAB — ANAEROBIC CULTURE

## 2017-11-22 LAB — FUNGUS CULTURE WITH STAIN

## 2017-11-22 LAB — FUNGUS CULTURE RESULT

## 2017-11-22 LAB — FUNGAL ORGANISM REFLEX

## 2017-11-26 ENCOUNTER — Encounter: Payer: Self-pay | Admitting: Pulmonary Disease

## 2017-11-26 ENCOUNTER — Ambulatory Visit (INDEPENDENT_AMBULATORY_CARE_PROVIDER_SITE_OTHER): Payer: Self-pay | Admitting: Pulmonary Disease

## 2017-11-26 ENCOUNTER — Telehealth: Payer: Self-pay | Admitting: Pulmonary Disease

## 2017-11-26 ENCOUNTER — Other Ambulatory Visit (INDEPENDENT_AMBULATORY_CARE_PROVIDER_SITE_OTHER): Payer: Self-pay

## 2017-11-26 VITALS — BP 142/88 | HR 87 | Temp 101.4°F | Ht 73.0 in | Wt 301.0 lb

## 2017-11-26 DIAGNOSIS — J309 Allergic rhinitis, unspecified: Secondary | ICD-10-CM

## 2017-11-26 DIAGNOSIS — J029 Acute pharyngitis, unspecified: Secondary | ICD-10-CM | POA: Insufficient documentation

## 2017-11-26 DIAGNOSIS — J209 Acute bronchitis, unspecified: Secondary | ICD-10-CM

## 2017-11-26 DIAGNOSIS — J849 Interstitial pulmonary disease, unspecified: Secondary | ICD-10-CM

## 2017-11-26 DIAGNOSIS — J069 Acute upper respiratory infection, unspecified: Secondary | ICD-10-CM | POA: Insufficient documentation

## 2017-11-26 LAB — BETA STREP SCREEN: STREPTOCOCCUS, GROUP A SCREEN (DIRECT): NEGATIVE

## 2017-11-26 MED ORDER — PREDNISONE 10 MG PO TABS: ORAL_TABLET | ORAL | 0 refills | Status: DC

## 2017-11-26 MED ORDER — AZITHROMYCIN 250 MG PO TABS: ORAL_TABLET | ORAL | 0 refills | Status: DC

## 2017-11-26 MED ORDER — FLUTICASONE PROPIONATE 50 MCG/ACT NA SUSP: 2.0000 | Freq: Every day | NASAL | 3 refills | Status: DC

## 2017-11-26 MED ORDER — LORATADINE 10 MG PO TABS: 10.0000 mg | ORAL_TABLET | Freq: Every day | ORAL | 4 refills | Status: DC

## 2017-11-26 MED ORDER — LORATADINE 10 MG PO TABS: 10.0000 mg | ORAL_TABLET | Freq: Every day | ORAL | 0 refills | Status: DC

## 2017-11-26 MED ORDER — FLUTICASONE PROPIONATE 50 MCG/ACT NA SUSP: 2.0000 | Freq: Every day | NASAL | 0 refills | Status: DC

## 2017-11-26 NOTE — Assessment & Plan Note (Signed)
Strep test in office today - negative Manage fever by taking over-the-counter Tylenol or ibuprofen Restart Flonase/fluticasone for nasal congestion - allergies  Restart Claritin daily for allergies Nasal Rinses as well as needed for congestion   Will start Azithromycin today  >>> Take 2 tablets (500 mg) today, take 1 tablet (250 mg) daily for the following 4 days  Prednisone >>>4 tabs for 2 days, then 3 tabs for 2 days, 2 tabs for 2 days, then 1 tab for 2 days, then stop  Follow-up with Dr. Kendrick FriesMcquaid in the next 4 to 6 weeks - or sooner if able.

## 2017-11-26 NOTE — Telephone Encounter (Signed)
Called and spoke to patient. Patient stated that he isn't feeling well and would like to be seen by someone today. Patient stated symptoms of: SOB when speaking, sore throat, cough and feels like something is on his wind pipe.    Scheduled an acute visit for today with Elisha HeadlandBrian Mack, NP at 1530.

## 2017-11-26 NOTE — Assessment & Plan Note (Signed)
Strep test in office today - negative Manage fever by taking over-the-counter Tylenol or ibuprofen Restart Flonase/fluticasone for nasal congestion - allergies  Restart Claritin daily for allergies Nasal Rinses as well as needed for congestion

## 2017-11-26 NOTE — Assessment & Plan Note (Signed)
ZPack today Prednisone taper Fluticasone 2 sprays each nostril daily Continue daily Claritin

## 2017-11-26 NOTE — Assessment & Plan Note (Signed)
Strep test in office is negative Strep test in office today - negative Manage fever by taking over-the-counter Tylenol or ibuprofen Restart Flonase/fluticasone for nasal congestion - allergies  Restart Claritin daily for allergies Nasal Rinses as well as needed for congestion

## 2017-11-26 NOTE — Progress Notes (Signed)
@Patient  ID: Paul Thomas, male    DOB: [birth year], 37 y.o.   MRN: [id number]  Chief Complaint  Patient presents with  . Acute Visit    Sore throat, Cough Mucus is brown in color.     Referring provider: Lupita Leash, MD  HPI: Referred in March 2019 for evaluation of an abnormal CXR that showed lymph node involvement. Pt of [facility].   Recent [facility] Encounters:  09/25/2017-office visit-initial-[facility] follow-up Paul Thomas is a pleasant 37 year old male with no past medical history who is never smoked cigarettes who comes to my clinic today for evaluation of an abnormal chest x-ray and bronchitis.  He says it for the last 2 months he has been coughing up yellow to brown mucus and has hoarseness.  This is also associated with a scratchy sensation in his throat.  He has sinus congestion and postnasal drip which he only recently started treating with Claritin and Flonase.  He says he also has acid reflux and he only takes Prilosec on an as-needed basis.  He denies any shortness of breath fevers chills or weight loss.  He says that his symptoms have been persistent for the last 2 months and he continues to cough up brown mucus now.  He was seen in the emergency room a week ago and was told that he had an abnormal chest x-ray so a referral was made to our clinic for further evaluation.  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 11/26/2017- telephone  encounter acute appointment set up patient is feeling short of breath when speaking, sore throat, cough feels like something is in his windpipe   Tests:   Imaging:  10/23/2017-chest x-ray- stable chest, no pneumothorax status post bronchoscopy, low lung volumes 10/02/2017-CT super D chest without contrast-extensive thoracic adenopathy, especially given pulmonary parenchymal findings including upper lobe perifissural nodularity, sarcoidosis is strongly favored, right greater than left gynecomastia  Cardiac:   Labs:   Micro:  10/23/2017-elective bronchoscopy 10/23/2017-fungal organism no growth 10/23/2017-fungal culture-no fungus observed 09/26/2017-AFB- negative 10/23/17-culture respiratory-few WBC present, predominantly mononuclear  Chart Review:    11/26/17  Acute  Patient reports today with 2 days of sore throat, nasal congestion, not feeling well, known sick contacts of sister and potentially is 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  Patient reports taking 1 100 mg tablet of doxycycline which she had (around the house).  This did not improve symptoms.  Patient did not report a fever on arrival the office today but he is febrile with a temperature of 101.6.  Patient is not taking any over-the-counter regimens at this time.  Patient is supposed to be on daily allergy medications, but patient thought he could stop these after the bronchoscopy.  Patient has not been taking his Claritin or Flonase recently.   No Known Allergies   There is no immunization history on file for this patient.  Past Medical History:  Diagnosis Date  . GERD (gastroesophageal reflux disease)   . Heart murmur     ????  when in high school during SPORT physical- never saw a cardiologist (no problem)  . History of kidney stones     Tobacco History: Social History   Tobacco Use  Smoking Status Never Smoker  Smokeless Tobacco Never Used   Counseling given: Yes   Outpatient Encounter Medications as of 11/26/2017    Medication Sig  . omeprazole (PRILOSEC OTC) 20 MG tablet Take 20 mg by mouth daily.  Marland Kitchen azithromycin (ZITHROMAX) 250 MG tablet 500mg  (two tablets) today, then 250mg  (1 tablet) for the next 4 days  . fluticasone (FLONASE) 50 MCG/ACT nasal spray Place 2 sprays into both nostrils daily.  Marland Kitchen loratadine (CLARITIN) 10 MG tablet Take 1 tablet (10 mg total) by mouth daily.  . predniSONE (DELTASONE) 10 MG tablet 4 tabs for 2 days, then 3 tabs for 2 days, 2 tabs for 2 days, then 1 tab for 2 days, then stop  . [DISCONTINUED] doxycycline (VIBRA-TABS) 100 MG tablet Take 1 tablet (100 mg total) by mouth 2 (two) times daily.  . [DISCONTINUED] fluticasone (FLONASE) 50 MCG/ACT nasal spray Place 2 sprays into both nostrils daily. (Patient not taking: Reported on 11/26/2017)  . [DISCONTINUED] fluticasone (FLONASE) 50 MCG/ACT nasal spray Place 2 sprays into both nostrils daily.  . [DISCONTINUED] loratadine (CLARITIN) 10 MG tablet Take 1 tablet (10 mg total) by mouth daily. (Patient not taking: Reported on 11/26/2017)  . [DISCONTINUED] loratadine (CLARITIN) 10 MG tablet Take 1 tablet (10 mg total) by mouth daily.   No facility-administered encounter medications on file as of 11/26/2017.      Review of Systems  Constitutional: +fever and fatigue   No  weight loss, night sweats, chills HEENT:   + st, nasal congestion, PNP, difficulty swallowing   No headaches,  Tooth/dental problems, No sneezing, itching, ear ache CV: No chest pain,  orthopnea, PND, swelling in lower extremities, anasarca, dizziness, palpitations, syncope  GI: No heartburn, indigestion, abdominal pain, nausea, vomiting, diarrhea, change in bowel habits, loss of appetite, bloody stools Resp: +cough, dry and productive, brown mucous - occasionally blood tinged,  Sob with talking at times  No excess mucus, no productive cough,  No non-productive cough, No wheezing.  No chest wall deformity Skin: no rash, lesions, no skin changes. GU: no dysuria, change in  color of urine, no urgency or frequency.  No flank pain, no hematuria  MS:  No joint pain or swelling.  No decreased range of motion.  No back pain. Psych:  No change in mood or affect. No depression or anxiety.  No memory loss.   Physical Exam  BP (!) 142/88   Pulse 87   Temp (!) 101.4 F (38.6 C) (Oral)   Ht 6\' 1"  (1.854 m)   Wt (!) 301 lb (136.5 kg)   SpO2 96%   BMI 39.71 kg/m   GEN: A/Ox3; pleasant , NAD, well nourished, obese    HEENT:  [place]/AT, Periorbital eye swelling bilaterally, EACs-clear, TMs-wnl with effusions bilaterally, NOSE-clear, THROAT- erythematous, exudates, post nasal drip, Mallampati IV   NECK:  Supple w/ fair ROM; no JVD; no thyromegaly or nodules palpated; no lymphadenopathy.    RESP:  Clear  P & A; w/o, wheezes/ rales/ or rhonchi. no accessory muscle use, no dullness to percussion  CARD:  RRR, no m/r/g, no peripheral edema, pulses intact, no cyanosis or clubbing.  GI:   Soft & nt; nml bowel sounds; no organomegaly or masses detected.   Musco: Warm bil, no deformities or joint swelling noted.   Neuro:  alert, no focal deficits noted.    Skin: Warm, no lesions or rashes    Lab Results:  CBC    Component Value Date/Time   WBC 5.1 10/11/2017 0839   RBC 4.95 10/11/2017 0839   HGB 13.3 10/11/2017 0839   HCT 41.1 10/11/2017 0839   PLT 358 10/11/2017 0839   MCV 83.0 10/11/2017 0839   MCH 26.9 10/11/2017 0839   MCHC 32.4 10/11/2017 0839   RDW 14.7 10/11/2017 0839    BMET    Component Value Date/Time   NA 135 10/11/2017 0839   K 3.8 10/11/2017 0839   CL 100 (L) 10/11/2017 0839   CO2 24 10/11/2017 0839   GLUCOSE 117 (H) 10/11/2017 0839   BUN 11 10/11/2017 0839   CREATININE 1.11 10/11/2017 0839   CALCIUM 8.9 10/11/2017 0839   GFRNONAA >60 10/11/2017 0839   GFRAA >60 10/11/2017 0839    BNP No results found for: BNP  ProBNP No results found for: PROBNP  Imaging: No results found.   Assessment & Plan:   37 year old male patient  follows up today with complaints of 2 days of sore throat and nasal congestion.  Strep test was negative.  Will treat as acute upper respiratory infection with bronchitis.  Z-Pak today, prednisone taper Encourage patient to ensure they have close follow-up with Dr. Kendrick [facility] as we continue to work-up his potential sarcoidosis.  Patient did not know that he needs to call office or follow-up with an appointment.  Patient to be scheduled for follow-up today.  Patient had stopped taking his allergy medications after his bronchoscopy as he felt that he did not need to do this anymore.  AR flare on assessment today.  Restart Claritin and restart Flonase.   Discussed with patient importance to consider getting preventative vaccines.  Patient to double check with primary care to see if he has had pneumonia vaccines.  Upper respiratory infection, acute ZPack today Prednisone taper Fluticasone 2 sprays each nostril daily Continue daily Claritin   ILD (interstitial lung disease) (HCC) Follow-up with Dr. Kendrick FriesMcquaid  Bronchitis, acute Strep test in office today - negative Manage fever by taking over-the-counter Tylenol or ibuprofen Restart Flonase/fluticasone for nasal congestion - allergies  Restart Claritin daily for allergies Nasal Rinses as well as needed for congestion   Will start Azithromycin today  >>> Take 2 tablets (500 mg) today, take 1 tablet (250 mg) daily for the following 4 days  Prednisone >>>4 tabs for 2 days, then 3 tabs for 2 days, 2 tabs for 2 days, then 1 tab for 2 days, then stop  Follow-up with Dr. Kendrick F[facility] in the next 4 to 6 weeks - or sooner if able.     Allergic rhinitis Strep test in office today - negative Manage fever by taking over-the-counter Tylenol or ibuprofen Restart Flonase/fluticasone for nasal congestion - allergies  Restart Claritin daily for allergies Nasal Rinses as well as needed for congestion     Sore throat Strep test in office is negative Strep  test in office today - negative Manage fever by taking over-the-counter Tylenol or ibuprofen Restart Flonase/fluticasone for nasal congestion - allergies  Restart Claritin daily for allergies Nasal Rinses as well as needed for congestion        Paul CeoBrian P Caryl Manas, NP 11/26/2017

## 2017-11-26 NOTE — Assessment & Plan Note (Signed)
Follow-up with Dr. Kendrick Fries[facility]

## 2017-11-26 NOTE — Patient Instructions (Addendum)
Strep test in office today - negative Manage fever by taking over-the-counter Tylenol or ibuprofen Restart Flonase/fluticasone for nasal congestion - allergies  Restart Claritin daily for allergies Nasal Rinses as well as needed for congestion   Will start Azithromycin today  >>> Take 2 tablets (500 mg) today, take 1 tablet (250 mg) daily for the following 4 days  Prednisone >>>4 tabs for 2 days, then 3 tabs for 2 days, 2 tabs for 2 days, then 1 tab for 2 days, then stop  Follow-up with Dr. Kendrick [facility] in the next 4 to 6 weeks - or sooner if able.     Please contact the office if your symptoms worsen or you have concerns that you are not improving.   Thank you for choosing [facility] for your healthcare, and for allowing us to partner with you on your healthcare journey. I am thankful to be able to provide care to you today.   Elisha HeadlandBrian Mack FNP-C

## 2017-11-27 NOTE — Progress Notes (Signed)
Reviewed with patient in office visit.  No further changes needed at this time.  No changes to plan of care.  Elisha HeadlandBrian Aveen Stansel FNP

## 2017-11-27 NOTE — Progress Notes (Signed)
Reviewed, agree 

## 2017-11-29 ENCOUNTER — Ambulatory Visit (INDEPENDENT_AMBULATORY_CARE_PROVIDER_SITE_OTHER): Payer: Self-pay | Admitting: Pulmonary Disease

## 2017-11-29 ENCOUNTER — Encounter: Payer: Self-pay | Admitting: Pulmonary Disease

## 2017-11-29 VITALS — BP 130/90 | HR 87 | Ht 73.0 in | Wt 302.0 lb

## 2017-11-29 DIAGNOSIS — J309 Allergic rhinitis, unspecified: Secondary | ICD-10-CM

## 2017-11-29 DIAGNOSIS — J069 Acute upper respiratory infection, unspecified: Secondary | ICD-10-CM

## 2017-11-29 DIAGNOSIS — D869 Sarcoidosis, unspecified: Secondary | ICD-10-CM

## 2017-11-29 NOTE — Patient Instructions (Signed)
Pulmonary sarcoidosis: We will arrange for a lung function test If you have worsening cough, shortness of breath or other symptoms please let us know We will plan on seeing you back in April 2020 or sooner if needed We will check an EKG today to make sure there is no evidence of cardiac involvement, this is very unlikely.  Allergic rhinitis: Finish taking the prednisone and azithromycin that was prescribed for the recent acute flare of sinusitis Continue taking nasal fluticasone 2 puffs each nostril through the fall Continue Claritin If you have increased mucus production try using saline rinses, I like NeilMed rinses available from the pharmacy  Weight loss: The following behaviors have been associated with weight loss: Weigh yourself daily Write down everything you eat Drink a glass of water prior to eating a meal Only eat when you are hungry Buy food from the periphery of the grocery store, not the middle  We will see you back in April 2020 or sooner if needed

## 2017-11-29 NOTE — Progress Notes (Signed)
Subjective:   PATIENT ID: Paul Thomas GENDER: male DOB: 10-Oct-1980, MRN: [id number]  Synopsis: Referred in March 2019 for cough, later diagnosed with sarcoidosis on bronchoscopy.  HPI  Chief Complaint  Patient presents with  . Follow-up    ROV, reports starting to feel better     Hooper says he is feeling better.  He says that he had stopped taking the Flonase for a while and the symptoms of cough came back. He had a lot of mucus in his throat and was given a prescription for prednisone and a Z-Pak which has helped him.  He says that he no longer has the symptoms of congestion which were all coming from his nose.  He says that he has been exercising regularly and he has lost over 30 pounds.  He has been careful about what he eats.  He feels much better.  Past Medical History:  Diagnosis Date  . GERD (gastroesophageal reflux disease)   . Heart murmur     ????when in high school during [place] physical- never saw a cardiologist (no problem)  . History of kidney stones      Review of Systems  Constitutional: Negative for chills, fever, malaise/fatigue and weight loss.  HENT: Positive for congestion. Negative for nosebleeds, sinus pain and sore throat.   Eyes: Negative for photophobia, pain and discharge.  Respiratory: Positive for cough. Negative for hemoptysis, sputum production, shortness of breath and wheezing.   Cardiovascular: Negative for chest pain, palpitations, orthopnea and leg swelling.  Gastrointestinal: Negative for abdominal pain, constipation, diarrhea, nausea and vomiting.  Genitourinary: Negative for dysuria, frequency, hematuria and urgency.  Musculoskeletal: Negative for back pain, joint pain, myalgias and neck pain.  Skin: Negative for itching and rash.  Neurological: Negative for tingling, tremors, sensory change, speech change, focal weakness, seizures, weakness and headaches.  Psychiatric/Behavioral: Negative for memory loss, substance abuse and  suicidal ideas. The patient is not nervous/anxious.       Objective:  Physical Exam   Vitals:   11/29/17 0920  BP: 130/90  Pulse: 87  SpO2: 98%  Weight: (!) 302 lb (137 kg)  Height: _0  (1.854 m)    Gen: well appearing HENT: OP clear, TM's clear, neck supple PULM: CTA B, normal percussion CV: RRR, no mgr, trace edema GI: BS+, soft, nontender Derm: no cyanosis or rash Psyche: normal mood and affect   CBC    Component Value Date/Time   WBC 5.1 10/11/2017 0839   RBC 4.95 10/11/2017 0839   HGB 13.3 10/11/2017 0839   HCT 41.1 10/11/2017 0839   PLT 358 10/11/2017 0839   MCV 83.0 10/11/2017 0839   MCH 26.9 10/11/2017 0839   MCHC 32.4 10/11/2017 0839   RDW 14.7 10/11/2017 0839     Chest imaging: March 2019 chest x-ray images independently reviewed showing lymphadenopathy bilaterally  PFT:  Labs:  Path: 4/30 Transbronchial biopsy: granuloma; FNA nodes 7 and 10L granuloma, BAL negative  Micro: 4/30 BAL bacterial, fungal, AFB all negative  Echo:  Heart Catheterization:  Records from his visit earlier this week reviewed where he was treated for acute sinusitis with prednisone and Z-Pak.     Assessment & Plan:   Sarcoidosis - Plan: Pulmonary function test, EKG 12-Lead  Upper respiratory infection, acute  Allergic rhinitis, unspecified seasonality, unspecified trigger  Discussion: [name] is feeling much better since receiving treatment for an acute flareup of sinusitis recently.  He has allergic rhinitis and had not been taking his  medications for this.  He also has pulmonary sarcoidosis.  At this time I see no evidence that the pulmonary sarcoidosis is giving him any sort of symptoms.  I explained to him today that the likelihood of his radiographic stage I disease progressing into anything worse is 10% or less.  However, we did talk about the importance of annual follow-ups with [place] for the next 2 to 3 years for pulmonary function testing.  We will  get an EKG to make sure there is no evidence of cardiac involvement today.  I also advised that he see an eye doctor routinely.  Plan: Pulmonary sarcoidosis: We will arrange for a lung function test If you have worsening cough, shortness of breath or other symptoms please let us know We will plan on seeing you back in April 2020 or sooner if needed We will check an EKG today to make sure there is no evidence of cardiac involvement, this is very unlikely.  Allergic rhinitis: Finish taking the prednisone and azithromycin that was prescribed for the recent acute flare of sinusitis Continue taking nasal fluticasone 2 puffs each nostril through the fall Continue Claritin If you have increased mucus production try using saline rinses, I like NeilMed rinses available from the pharmacy  Weight loss: The following behaviors have been associated with weight loss: Weigh yourself daily Write down everything you eat Drink a glass of water prior to eating a meal Only eat when you are hungry Buy food from the periphery of the grocery store, not the middle  We will see you back in April 2020 or sooner if needed   Current Outpatient Medications:  .  azithromycin (ZITHROMAX) 250 MG tablet, 537m (two tablets) today, then 2559m(1 tablet) for the next 4 days, Disp: 6 tablet, Rfl: 0 .  fluticasone (FLONASE) 50 MCG/ACT nasal spray, Place 2 sprays into both nostrils daily., Disp: 16 g, Rfl: 3 .  loratadine (CLARITIN) 10 MG tablet, Take 1 tablet (10 mg total) by mouth daily., Disp: 30 tablet, Rfl: 4 .  omeprazole (PRILOSEC OTC) 20 MG tablet, Take 20 mg by mouth daily., Disp: , Rfl:  .  predniSONE (DELTASONE) 10 MG tablet, 4 tabs for 2 days, then 3 tabs for 2 days, 2 tabs for 2 days, then 1 tab for 2 days, then stop, Disp: 20 tablet, Rfl: 0

## 2017-12-04 LAB — ACID FAST CULTURE WITH REFLEXED SENSITIVITIES (MYCOBACTERIA): Acid Fast Culture: NEGATIVE

## 2017-12-24 ENCOUNTER — Ambulatory Visit (INDEPENDENT_AMBULATORY_CARE_PROVIDER_SITE_OTHER): Payer: Self-pay | Admitting: Pulmonary Disease

## 2017-12-24 DIAGNOSIS — D869 Sarcoidosis, unspecified: Secondary | ICD-10-CM

## 2017-12-24 LAB — PULMONARY FUNCTION TEST
DL/VA % pred: 133 %
DL/VA: 6.23 ml/min/mmHg/L
DLCO unc % pred: 89 %
DLCO unc: 28.98 ml/min/mmHg
FEF 25-75 Post: 4.46 L/sec
FEF 25-75 Pre: 4.8 L/sec
FEF2575-%CHANGE-POST: -7 %
FEF2575-%Pred-Post: 116 %
FEF2575-%Pred-Pre: 125 %
FEV1-%CHANGE-POST: 0 %
FEV1-%Pred-Post: 88 %
FEV1-%Pred-Pre: 88 %
FEV1-PRE: 3.2 L
FEV1-Post: 3.22 L
FEV1FVC-%CHANGE-POST: 4 %
FEV1FVC-%Pred-Pre: 106 %
FEV6-%Change-Post: -3 %
FEV6-%Pred-Post: 81 %
FEV6-%Pred-Pre: 84 %
FEV6-Post: 3.54 L
FEV6-Pre: 3.67 L
FEV6FVC-%PRED-PRE: 102 %
FEV6FVC-%Pred-Post: 102 %
FVC-%CHANGE-POST: -3 %
FVC-%PRED-POST: 80 %
FVC-%Pred-Pre: 83 %
FVC-Post: 3.54 L
FVC-Pre: 3.67 L
POST FEV1/FVC RATIO: 91 %
PRE FEV6/FVC RATIO: 100 %
Post FEV6/FVC ratio: 100 %
Pre FEV1/FVC ratio: 87 %
RV % PRED: 185 %
RV: 3.33 L
TLC % pred: 96 %
TLC: 6.65 L

## 2017-12-24 NOTE — Progress Notes (Signed)
Patient completed full PFT today. 

## 2018-01-03 ENCOUNTER — Telehealth: Payer: Self-pay | Admitting: Pulmonary Disease

## 2018-01-03 NOTE — Telephone Encounter (Signed)
Called and spoke to patient. Relayed results per Dr. Kendrick FriesMcQuaid. Patient verbalized understanding.  Nothing further needed at this time.

## 2018-01-03 NOTE — Telephone Encounter (Signed)
Called and spoke with patient advised patient that I would send a message over to BQ to get results.    BQ please advise, thank you.

## 2018-01-03 NOTE — Telephone Encounter (Signed)
normal

## 2018-08-06 ENCOUNTER — Emergency Department (HOSPITAL_COMMUNITY): Payer: BLUE CROSS/BLUE SHIELD

## 2018-08-06 ENCOUNTER — Other Ambulatory Visit: Payer: Self-pay

## 2018-08-06 ENCOUNTER — Emergency Department (HOSPITAL_COMMUNITY)
Admission: EM | Admit: 2018-08-06 | Discharge: 2018-08-06 | Disposition: A | Discharge status: Home / Self Care | Payer: BLUE CROSS/BLUE SHIELD | Attending: Emergency Medicine | Admitting: Emergency Medicine

## 2018-08-06 ENCOUNTER — Encounter (HOSPITAL_COMMUNITY): Payer: Self-pay

## 2018-08-06 DIAGNOSIS — N201 Calculus of ureter: Secondary | ICD-10-CM | POA: Diagnosis not present

## 2018-08-06 DIAGNOSIS — R1031 Right lower quadrant pain: Secondary | ICD-10-CM | POA: Diagnosis present

## 2018-08-06 DIAGNOSIS — R109 Unspecified abdominal pain: Secondary | ICD-10-CM

## 2018-08-06 DIAGNOSIS — Z87442 Personal history of urinary calculi: Secondary | ICD-10-CM | POA: Insufficient documentation

## 2018-08-06 DIAGNOSIS — Z79899 Other long term (current) drug therapy: Secondary | ICD-10-CM | POA: Insufficient documentation

## 2018-08-06 LAB — CBC WITH DIFFERENTIAL/PLATELET
Abs Immature Granulocytes: 0.03 10*3/uL (ref 0.00–0.07)
Basophils Absolute: 0 10*3/uL (ref 0.0–0.1)
Basophils Relative: 0 %
EOS PCT: 2 %
Eosinophils Absolute: 0.1 10*3/uL (ref 0.0–0.5)
HEMATOCRIT: 42.6 % (ref 39.0–52.0)
HEMOGLOBIN: 13.1 g/dL (ref 13.0–17.0)
Immature Granulocytes: 0 %
LYMPHS PCT: 8 %
Lymphs Abs: 0.6 10*3/uL — ABNORMAL LOW (ref 0.7–4.0)
MCH: 25.5 pg — AB (ref 26.0–34.0)
MCHC: 30.8 g/dL (ref 30.0–36.0)
MCV: 83 fL (ref 80.0–100.0)
MONO ABS: 1 10*3/uL (ref 0.1–1.0)
Monocytes Relative: 13 %
Neutro Abs: 6.1 10*3/uL (ref 1.7–7.7)
Neutrophils Relative %: 77 %
Platelets: 359 10*3/uL (ref 150–400)
RBC: 5.13 MIL/uL (ref 4.22–5.81)
RDW: 14.9 % (ref 11.5–15.5)
WBC: 8 10*3/uL (ref 4.0–10.5)
nRBC: 0 % (ref 0.0–0.2)

## 2018-08-06 LAB — BASIC METABOLIC PANEL
Anion gap: 10 (ref 5–15)
BUN: 12 mg/dL (ref 6–20)
CALCIUM: 9.3 mg/dL (ref 8.9–10.3)
CHLORIDE: 100 mmol/L (ref 98–111)
CO2: 26 mmol/L (ref 22–32)
CREATININE: 1.49 mg/dL — AB (ref 0.61–1.24)
GFR calc non Af Amer: 58 mL/min — ABNORMAL LOW (ref >60)
Glucose, Bld: 130 mg/dL — ABNORMAL HIGH (ref 70–99)
Potassium: 3.9 mmol/L (ref 3.5–5.1)
SODIUM: 136 mmol/L (ref 135–145)

## 2018-08-06 LAB — URINALYSIS, ROUTINE W REFLEX MICROSCOPIC
BILIRUBIN URINE: NEGATIVE
Glucose, UA: NEGATIVE mg/dL
KETONES UR: NEGATIVE mg/dL
Leukocytes,Ua: NEGATIVE
Nitrite: NEGATIVE
Protein, ur: 30 mg/dL — AB
SPECIFIC GRAVITY, URINE: 1.023 (ref 1.005–1.030)
pH: 5 (ref 5.0–8.0)

## 2018-08-06 MED ORDER — ONDANSETRON HCL 4 MG/2ML IJ SOLN
4.0000 mg | Freq: Once | INTRAMUSCULAR | Status: AC
  Administered 2018-08-06: 4 mg via INTRAVENOUS
  Filled 2018-08-06: qty 2

## 2018-08-06 MED ORDER — HYDROMORPHONE HCL 1 MG/ML IJ SOLN
1.0000 mg | Freq: Once | INTRAMUSCULAR | Status: AC
  Administered 2018-08-06: 1 mg via INTRAVENOUS
  Filled 2018-08-06: qty 1

## 2018-08-06 MED ORDER — ONDANSETRON 8 MG PO TBDP: 8.0000 mg | ORAL_TABLET | Freq: Three times a day (TID) | ORAL | 0 refills | Status: DC | PRN

## 2018-08-06 MED ORDER — OXYCODONE-ACETAMINOPHEN 5-325 MG PO TABS
1.0000 | ORAL_TABLET | ORAL | Status: AC | PRN
  Administered 2018-08-06 (×2): 1 via ORAL
  Filled 2018-08-06 (×2): qty 1

## 2018-08-06 MED ORDER — OXYCODONE-ACETAMINOPHEN 5-325 MG PO TABS: 2.0000 | ORAL_TABLET | Freq: Four times a day (QID) | ORAL | 0 refills | Status: DC | PRN

## 2018-08-06 MED ORDER — PROMETHAZINE HCL 25 MG/ML IJ SOLN
12.5000 mg | Freq: Once | INTRAMUSCULAR | Status: AC
  Administered 2018-08-06: 12.5 mg via INTRAMUSCULAR
  Filled 2018-08-06: qty 1

## 2018-08-06 MED ORDER — TAMSULOSIN HCL 0.4 MG PO CAPS: 0.4000 mg | ORAL_CAPSULE | Freq: Every day | ORAL | 0 refills | Status: AC

## 2018-08-06 NOTE — Discharge Instructions (Addendum)
You were seen in the emergency department and found to have a kidney stone.  We are sending you home with multiple medications to assist with passing the stone:   -Flomax-this is a medication to help pass the stone, it allows urine to exit the body more freely.  Please take this once daily with a meal.  -Ibuprofen 800 mg-this is a medication that will help with pain as well as passing the stone.  Please take this every 8 hours.  Take this with food as it can cause stomach upset and at worst stomach bleeding.  Do not take other NSAIDs such as Motrin, Aleve, Advil, Mobic, or Naproxen with this medicine as they are similar and would propagate any potential side effects.   -Percocet-this is a narcotic/controlled substance medication that has potential addicting qualities.  We recommend that you take 1-2 tablets every 6 hours as needed for severe pain.  Do not drive or operate heavy machinery when taking this medicine as it can be sedating. Do not drink alcohol or take other sedating medications when taking this medicine for safety reasons.  Keep this out of reach of small children.  Please be aware this medicine has Tylenol in it (325 mg/tab) do not exceed the maximum dose of Tylenol in a day per over the counter recommendations should you decide to supplement with Tylenol over the counter.   -Zofran-this is an antinausea medication, you may take this every 8 hours as needed for nausea and vomiting, please allow the tablet to dissolve underneath of your tongue.   We have prescribed you new medication(s) today. Discuss the medications prescribed today with your pharmacist as they can have adverse effects and interactions with your other medicines including over the counter and prescribed medications. Seek medical evaluation if you start to experience new or abnormal symptoms after taking one of these medicines, seek care immediately if you start to experience difficulty breathing, feeling of your throat  closing, facial swelling, or rash as these could be indications of a more serious allergic reaction  Please follow-up with the urology group provided in your discharge instructions within 3 to 5 days.  Return to the ER for new or worsening symptoms including but not limited to worsening pain not controlled by these medicines, inability to keep fluids down, fever, or any other concerns that you may have.  

## 2018-08-06 NOTE — ED Notes (Signed)
Patient verbalizes understanding of discharge instructions. Opportunity for questioning and answers were provided. Armband removed by staff, pt discharged from ED.  

## 2018-08-06 NOTE — ED Notes (Signed)
Pt given cup for urine sample 

## 2018-08-06 NOTE — ED Notes (Signed)
Patient transported to CT 

## 2018-08-06 NOTE — ED Notes (Signed)
Pt given urine strainer upon discharge 

## 2018-08-06 NOTE — ED Triage Notes (Signed)
Pt here for right sided flank pain that started just PTA. Has hx of kidney stone and states this feels similar. Denies any N/V.

## 2018-08-06 NOTE — ED Provider Notes (Signed)
[facility] Note   CSN: [id number] Arrival date & time: 08/06/18  1601     History   Chief Complaint Chief Complaint  Patient presents with  . Flank Pain    HPI Paul Thomas is a 38 y.o. male with history of GERD and kidney stones presents to emergency department today with chief complaint of right flank pain.  Onset is acute, it started 12 hours ago. He describes the pain as sharp and located in his right flank.  It radiates to his stomach. He describes the pain 10/10 severity.  He took Tylenol and denies symptom relief.  Patient reports he had a kidney stone on the left years ago and the pain feels similar.  Patient denies any alleviating factors.  Denies hematuria, vomiting, nausea, diarrhea, chest pain, shortness of breath, fevers, chills.   Past Medical History:  Diagnosis Date  . GERD (gastroesophageal reflux disease)   . Heart murmur     ????when in high school during [employer] physical- never saw a cardiologist (no problem)  . History of kidney stones     Patient Active Problem List   Diagnosis Date Noted  . Sore throat 11/26/2017  . Bronchitis, acute 11/26/2017  . Upper respiratory infection, acute 11/26/2017  . Allergic rhinitis 11/26/2017  . ILD (interstitial lung disease) (HCC)   . Mediastinal lymphadenopathy     Past Surgical History:  Procedure Laterality Date  . NO PAST SURGERIES    . VIDEO BRONCHOSCOPY WITH ENDOBRONCHIAL ULTRASOUND N/A 10/23/2017   Procedure: VIDEO BRONCHOSCOPY WITH ENDOBRONCHIAL ULTRASOUND;  Surgeon: Lupita LeashMcQuaid, Douglas B, MD;  Location: [facility] OR;  Service: Thoracic;  Laterality: N/A;        Home Medications    Prior to Admission medications   Medication Sig Start Date End Date Taking? Authorizing Provider  azithromycin (ZITHROMAX) 250 MG tablet 500mg  (two tablets) today, then 250mg  (1 tablet) for the next 4 days 11/26/17   Coral CeoMack, Brian P, NP  fluticasone (FLONASE) 50 MCG/ACT nasal spray  Place 2 sprays into both nostrils daily. 11/26/17 11/26/18  Coral CeoMack, Brian P, NP  loratadine (CLARITIN) 10 MG tablet Take 1 tablet (10 mg total) by mouth daily. 11/26/17   Coral CeoMack, Brian P, NP  omeprazole (PRILOSEC OTC) 20 MG tablet Take 20 mg by mouth daily.    [provider]  ondansetron (ZOFRAN ODT) 8 MG disintegrating tablet Take 1 tablet (8 mg total) by mouth every 8 (eight) hours as needed for nausea or vomiting. 08/06/18   Albrizze, Yvonna AlanisKaitlyn E, PA-C  oxyCODONE-acetaminophen (PERCOCET/ROXICET) 5-325 MG tablet Take 2 tablets by mouth every 6 (six) hours as needed for severe pain. 08/06/18   Albrizze, Kaitlyn E, PA-C  predniSONE (DELTASONE) 10 MG tablet 4 tabs for 2 days, then 3 tabs for 2 days, 2 tabs for 2 days, then 1 tab for 2 days, then stop 11/26/17   Coral CeoMack, Brian P, NP  tamsulosin (FLOMAX) 0.4 MG CAPS capsule Take 1 capsule (0.4 mg total) by mouth daily after breakfast for 10 days. Take 30 minutes after eating. 08/06/18 08/16/18  Albrizze, Caroleen HammanKaitlyn E, PA-C    Family History Family History  Problem Relation Age of Onset  . Heart attack Father     Social History Social History   Tobacco Use  . Smoking status: Never Smoker  . Smokeless tobacco: Never Used  Substance Use Topics  . Alcohol use: Not Currently    Frequency: Never    Comment: occ  . Drug use: Never  Allergies   Patient has no known allergies.   Review of Systems Review of Systems  Gastrointestinal: Negative for abdominal pain.  Genitourinary: Positive for flank pain. Negative for difficulty urinating and hematuria.  All other systems reviewed and are negative.    Physical Exam Updated Vital Signs BP 121/76 (BP Location: Right Arm)   Pulse 72   Temp 98.2 F (36.8 C) (Oral)   Resp 18   SpO2 95%   Physical Exam Vitals signs and nursing note reviewed.  Constitutional:      Appearance: He is not ill-appearing or toxic-appearing.     Comments: Patient appears uncomfortable secondary to flank pain.  He  continuously tries to reposition himself on the stretcher.  Patient not under acute distress.  HENT:     Head: Normocephalic and atraumatic.     Nose: Nose normal.     Mouth/Throat:     Mouth: Mucous membranes are moist.     Pharynx: Oropharynx is clear.  Eyes:     General: No scleral icterus.    Conjunctiva/sclera: Conjunctivae normal.  Neck:     Musculoskeletal: Normal range of motion.  Cardiovascular:     Rate and Rhythm: Normal rate and regular rhythm.     Pulses: Normal pulses.     Heart sounds: Normal heart sounds.  Pulmonary:     Effort: Pulmonary effort is normal.     Breath sounds: Normal breath sounds.  Abdominal:     General: There is no distension.     Palpations: Abdomen is soft.     Tenderness: There is no abdominal tenderness. There is no right CVA tenderness, left CVA tenderness, guarding or rebound.  Musculoskeletal: Normal range of motion.  Skin:    General: Skin is warm and dry.     Capillary Refill: Capillary refill takes less than 2 seconds.  Neurological:     Mental Status: He is alert. Mental status is at baseline.     Motor: No weakness.  Psychiatric:        Behavior: Behavior normal.      ED Treatments / Results  Labs (all labs ordered are listed, but only abnormal results are displayed) Labs Reviewed  URINALYSIS, ROUTINE W REFLEX MICROSCOPIC - Abnormal; Notable for the following components:      Result Value   APPearance HAZY (*)    Hgb urine dipstick LARGE (*)    Protein, ur 30 (*)    Bacteria, UA RARE (*)    All other components within normal limits  CBC WITH DIFFERENTIAL/PLATELET - Abnormal; Notable for the following components:   MCH 25.5 (*)    Lymphs Abs 0.6 (*)    All other components within normal limits  BASIC METABOLIC PANEL - Abnormal; Notable for the following components:   Glucose, Bld 130 (*)    Creatinine, Ser 1.49 (*)    GFR calc non Af Amer 58 (*)    All other components within normal limits     EKG None  Radiology US Renal  Result Date: 08/06/2018 CLINICAL DATA:  Right flank pain EXAM: RENAL / URINARY TRACT ULTRASOUND COMPLETE COMPARISON:  None. FINDINGS: Right Kidney: Renal measurements: 14.8 x 6.4 x 7.4 cm = volume: 372 mL . Echogenicity within normal limits. No nephrolithiasis. Mild ectasia of the intrarenal collecting system is noted. The remainder appears to be vascular in etiology simulating hydronephrosis. Left Kidney: Renal measurements: 13.3 x 6.9 x 5.9 cm = volume: 291 mL. Echogenicity within normal limits. No mass or hydronephrosis visualized.  Bladder: Appears normal for degree of bladder distention. IMPRESSION: Mild ectasia of the intrarenal right-sided collecting system without definite obstructive uropathy or nephrolithiasis. Electronically Signed   By: Tollie Ethavid  Kwon M.D.   On: 08/06/2018 20:33   Ct Renal Stone Study  Result Date: 08/06/2018 CLINICAL DATA:  Right-sided flank pain EXAM: CT ABDOMEN AND PELVIS WITHOUT CONTRAST TECHNIQUE: Multidetector CT imaging of the abdomen and pelvis was performed following the standard protocol without IV contrast. COMPARISON:  Ultrasound from earlier in the same day. FINDINGS: Lower chest: No acute abnormality. Hepatobiliary: No focal liver abnormality is seen. No gallstones, gallbladder wall thickening, or biliary dilatation. Pancreas: Unremarkable. No pancreatic ductal dilatation or surrounding inflammatory changes. Spleen: Normal in size without focal abnormality. Adrenals/Urinary Tract: Adrenal glands are within normal limits. The left kidney is unremarkable without renal calculi or obstructive changes. The bladder is well distended. The right kidney demonstrates some mild fullness of the renal collecting system. No definitive renal calculi are seen. The right ureter is prominent extending inferiorly to a small 2 mm stone in the distal right ureter best seen on image number 80 of series 3. Stomach/Bowel: Mild diverticular change of the  colon is noted without evidence of diverticulitis. No obstructive changes are seen. The appendix is within normal limits. No small bowel or gastric abnormality is seen. Vascular/Lymphatic: No significant vascular findings are present. No enlarged abdominal or pelvic lymph nodes. Reproductive: Prostate is unremarkable. Other: No abdominal wall hernia or abnormality. No abdominopelvic ascites. Musculoskeletal: No acute or significant osseous findings. IMPRESSION: 2 mm distal right ureteral stone just above the ureterovesical junction with mild obstructive change. Mild diverticular change without diverticulitis. Electronically Signed   By: Alcide CleverMark  Lukens M.D.   On: 08/06/2018 21:53    Procedures Procedures (including critical care time)  Medications Ordered in ED Medications  oxyCODONE-acetaminophen (PERCOCET/ROXICET) 5-325 MG per tablet 1 tablet (1 tablet Oral Given 08/06/18 2326)  ondansetron (ZOFRAN) injection 4 mg (4 mg Intravenous Given 08/06/18 1844)  HYDROmorphone (DILAUDID) injection 1 mg (1 mg Intravenous Given 08/06/18 1844)  HYDROmorphone (DILAUDID) injection 1 mg (1 mg Intravenous Given 08/06/18 2116)  ondansetron (ZOFRAN) injection 4 mg (4 mg Intravenous Given 08/06/18 2116)  promethazine (PHENERGAN) injection 12.5 mg (12.5 mg Intramuscular Given 08/06/18 2326)     Initial Impression / Assessment and Plan / ED Course  I have reviewed the triage vital signs and the nursing notes.  Pertinent labs & imaging results that were available during my care of the patient were reviewed by me and considered in my medical decision making (see chart for details).    Pt has been diagnosed with a kidney stone via CT renal scan study.  He has a 2 mm stone located in distal right ureterjust above the ureterovesical junction with mild obstructive change.  There is no evidence of significant hydronephrosis. He has a slight bump in his creatinine of 1.49 compared to 10 months ago when it was 1.11.  His UA shows  large hemoglobin in the setting of a kidney stone.  His UA is unremarkable for urinary tract infection .  His pain was controlled with IV Dilaudid.  Patient tolerating p.o. intake in the ED after Zofran. Vitals sign stable and the pt does not have irratractable vomiting. Pt will be dc home with pain medications, flomax & has been advised to follow up with PCP in 1 week to get creatinine rechecked. Discussed strict ED return precautions. Pt verbalized understanding of and is in agreement with this plan. Pt  stable for discharge home at this time. Pt case discussed with Dr. Freida Busman who agrees with my plan.      Final Clinical Impressions(s) / ED Diagnoses   Final diagnoses:  Flank pain    ED Discharge Orders         Ordered    oxyCODONE-acetaminophen (PERCOCET/ROXICET) 5-325 MG tablet  Every 6 hours PRN     08/06/18 2258    tamsulosin (FLOMAX) 0.4 MG CAPS capsule  Daily after breakfast     08/06/18 2258    ondansetron (ZOFRAN ODT) 8 MG disintegrating tablet  Every 8 hours PRN     08/06/18 2258           Kathyrn Lass 08/07/18 0033    Lorre Nick, MD 08/07/18 1121

## 2018-08-16 NOTE — Progress Notes (Signed)
_0  ID: Paul Thomas, male    DOB: [birth year], 38 y.o.   MRN: [id number]  Chief Complaint  Patient presents with  . Hospitalization Follow-up    hospital follow up    Referring provider: Juanito Doom, MD  HPI 38 year old male never smoker with sarcoidosis by Dr. Lake Bells.  Tests:  Chest imaging: March 2019 chest x-ray images independently reviewed showing lymphadenopathy bilaterally  Path: 4/30 Transbronchial biopsy: granuloma; FNA nodes 7 and 10L granuloma, BAL negative  Micro: 4/30 BAL bacterial, fungal, AFB all negative  ED Note 08/06/18: Pt has been diagnosed with a kidney stone via CT renal scan study.  He has a 2 mm stone located in distal right ureterjust above the ureterovesical junction with mild obstructive change.  There is no evidence of significant hydronephrosis. He has a slight bump in his creatinine of 1.49 compared to 10 months ago when it was 1.11.  His UA shows large hemoglobin in the setting of a kidney stone.  His UA is unremarkable for urinary tract infection .  His pain was controlled with IV Dilaudid.  Patient tolerating p.o. intake in the ED after Zofran. Vitals sign stable and the pt does not have irratractable vomiting. Pt will be dc home with pain medications, flomax & has been advised to follow up with PCP in 1 week to get creatinine rechecked. Discussed strict ED return precautions.  OV 08/19/18 - ED follow up Patient presents today for ED follow-up.  He was seen in the ED on 08/06/2018 and was found to have a 2 mm kidney stone in right ureter.  He states that he did passed a kidney stone.  He has been doing well since discharge.  He was advised to follow-up with PCP in 1 week to get creatinine rechecked the patient states that he does not have a PCP.  States that he has only been seeing Dr. Lake Bells for sarcoid that he followed up here today.  He has been doing well overall.  Denies any shortness of breath.  Denies any flank pain.  He  states that he has seen ENT since his last visit here for his chronic sinus issues/allergic rhinitis.  He denies any recent fevers.  He denies any shortness of breath, chest pain, or edema.  No Known Allergies   There is no immunization history on file for this patient.  Past Medical History:  Diagnosis Date  . GERD (gastroesophageal reflux disease)   . Heart murmur     ????when in high school during [place] physical- never saw a cardiologist (no problem)  . History of kidney stones     Tobacco History: Social History   Tobacco Use  Smoking Status Never Smoker  Smokeless Tobacco Never Used   Counseling given: Not Answered   Outpatient Encounter Medications as of 08/19/2018  Medication Sig  . fluticasone (FLONASE) 50 MCG/ACT nasal spray Place 2 sprays into both nostrils daily.  Marland Kitchen omeprazole (PRILOSEC OTC) 20 MG tablet Take 20 mg by mouth daily.  . [DISCONTINUED] azithromycin (ZITHROMAX) 250 MG tablet 567m (two tablets) today, then 2516m(1 tablet) for the next 4 days  . [DISCONTINUED] loratadine (CLARITIN) 10 MG tablet Take 1 tablet (10 mg total) by mouth daily.  . [DISCONTINUED] ondansetron (ZOFRAN ODT) 8 MG disintegrating tablet Take 1 tablet (8 mg total) by mouth every 8 (eight) hours as needed for nausea or vomiting.  . [DISCONTINUED] oxyCODONE-acetaminophen (PERCOCET/ROXICET) 5-325 MG tablet Take 2 tablets by mouth every 6 (six) hours as needed  for severe pain.  . [DISCONTINUED] predniSONE (DELTASONE) 10 MG tablet 4 tabs for 2 days, then 3 tabs for 2 days, 2 tabs for 2 days, then 1 tab for 2 days, then stop  . [EXPIRED] tamsulosin (FLOMAX) 0.4 MG CAPS capsule Take 1 capsule (0.4 mg total) by mouth daily after breakfast for 10 days. Take 30 minutes after eating.   No facility-administered encounter medications on file as of 08/19/2018.      Review of Systems  Review of Systems  Constitutional: Negative.  Negative for chills and fever.  HENT: Negative.   Respiratory:  Negative for cough and shortness of breath.   Cardiovascular: Negative.  Negative for chest pain, palpitations and leg swelling.  Gastrointestinal: Negative.   Allergic/Immunologic: Negative.   Neurological: Negative.   Psychiatric/Behavioral: Negative.        Physical Exam  BP (!) 146/78 (BP Location: Right Arm, Patient Position: Sitting, Cuff Size: Normal)   Pulse 71   Temp 98.5 F (36.9 C)   Ht _0  (1.854 m)   Wt 296 lb (134.3 kg)   SpO2 98%   BMI 39.05 kg/m   Wt Readings from Last 5 Encounters:  08/19/18 296 lb (134.3 kg)  11/29/17 (!) 302 lb (137 kg)  11/26/17 (!) 301 lb (136.5 kg)  10/23/17 (!) 314 lb (142.4 kg)  10/11/17 (!) 314 lb 14.4 oz (142.8 kg)     Physical Exam Vitals signs and nursing note reviewed.  Constitutional:      General: He is not in acute distress.    Appearance: He is well-developed.  Cardiovascular:     Rate and Rhythm: Normal rate and regular rhythm.  Pulmonary:     Effort: Pulmonary effort is normal. No respiratory distress.     Breath sounds: Normal breath sounds. No wheezing or rhonchi.  Musculoskeletal:        General: No swelling.  Skin:    General: Skin is warm and dry.  Neurological:     Mental Status: He is alert and oriented to person, place, and time.     Imaging: US Renal  Result Date: 08/06/2018 CLINICAL DATA:  Right flank pain EXAM: RENAL / URINARY TRACT ULTRASOUND COMPLETE COMPARISON:  None. FINDINGS: Right Kidney: Renal measurements: 14.8 x 6.4 x 7.4 cm = volume: 372 mL . Echogenicity within normal limits. No nephrolithiasis. Mild ectasia of the intrarenal collecting system is noted. The remainder appears to be vascular in etiology simulating hydronephrosis. Left Kidney: Renal measurements: 13.3 x 6.9 x 5.9 cm = volume: 291 mL. Echogenicity within normal limits. No mass or hydronephrosis visualized. Bladder: Appears normal for degree of bladder distention. IMPRESSION: Mild ectasia of the intrarenal right-sided  collecting system without definite obstructive uropathy or nephrolithiasis. Electronically Signed   By: Ashley Royalty M.D.   On: 08/06/2018 20:33   Ct Renal Stone Study  Result Date: 08/06/2018 CLINICAL DATA:  Right-sided flank pain EXAM: CT ABDOMEN AND PELVIS WITHOUT CONTRAST TECHNIQUE: Multidetector CT imaging of the abdomen and pelvis was performed following the standard protocol without IV contrast. COMPARISON:  Ultrasound from earlier in the same day. FINDINGS: Lower chest: No acute abnormality. Hepatobiliary: No focal liver abnormality is seen. No gallstones, gallbladder wall thickening, or biliary dilatation. Pancreas: Unremarkable. No pancreatic ductal dilatation or surrounding inflammatory changes. Spleen: Normal in size without focal abnormality. Adrenals/Urinary Tract: Adrenal glands are within normal limits. The left kidney is unremarkable without renal calculi or obstructive changes. The bladder is well distended. The right kidney demonstrates some mild  fullness of the renal collecting system. No definitive renal calculi are seen. The right ureter is prominent extending inferiorly to a small 2 mm stone in the distal right ureter best seen on image number 80 of series 3. Stomach/Bowel: Mild diverticular change of the colon is noted without evidence of diverticulitis. No obstructive changes are seen. The appendix is within normal limits. No small bowel or gastric abnormality is seen. Vascular/Lymphatic: No significant vascular findings are present. No enlarged abdominal or pelvic lymph nodes. Reproductive: Prostate is unremarkable. Other: No abdominal wall hernia or abnormality. No abdominopelvic ascites. Musculoskeletal: No acute or significant osseous findings. IMPRESSION: 2 mm distal right ureteral stone just above the ureterovesical junction with mild obstructive change. Mild diverticular change without diverticulitis. Electronically Signed   By: Inez Catalina M.D.   On: 08/06/2018 21:53      Assessment & Plan:   Kidney stone Patient presents for ED follow-up for kidney stone.  He does not have a PCP and would like a referral to be set up with a PCP.  We discussed that his creatinine was elevated in the ED and will recheck a BMP in the office today.  We also discussed that his blood sugar has been elevated the last 2 checks and his blood pressure was borderline today.  Discussed that it would be good for him to get in with a PCP and have a physical.  Placed a referral for PCP today.  Patient agrees and will see them as soon as he can get an appointment.  In the meantime, please work on [employer] and healthy diet.  Please stay well-hydrated.  Patient Instructions  Glad you are better Will recheck BMP today - recheck creatinine  Continue Flonase Continue Claritin Work on healthy weight Will place referral for PCP Follow up with Dr. Lake Bells in 3 months or sooner if needed    Elevated serum creatinine Recheck BMP in office today and call with results     Fenton Foy, NP 08/19/2018

## 2018-08-19 ENCOUNTER — Encounter: Payer: Self-pay | Admitting: Nurse Practitioner

## 2018-08-19 ENCOUNTER — Ambulatory Visit (INDEPENDENT_AMBULATORY_CARE_PROVIDER_SITE_OTHER): Payer: Self-pay | Admitting: Nurse Practitioner

## 2018-08-19 VITALS — BP 146/78 | HR 71 | Temp 98.5°F | Ht 73.0 in | Wt 296.0 lb

## 2018-08-19 DIAGNOSIS — N200 Calculus of kidney: Secondary | ICD-10-CM | POA: Insufficient documentation

## 2018-08-19 DIAGNOSIS — R7989 Other specified abnormal findings of blood chemistry: Secondary | ICD-10-CM

## 2018-08-19 LAB — BASIC METABOLIC PANEL
BUN: 16 mg/dL (ref 6–23)
CO2: 26 mEq/L (ref 19–32)
Calcium: 9.2 mg/dL (ref 8.4–10.5)
Chloride: 101 mEq/L (ref 96–112)
Creatinine, Ser: 1.19 mg/dL (ref 0.40–1.50)
GFR: 82.31 mL/min (ref >60.00)
Glucose, Bld: 104 mg/dL — ABNORMAL HIGH (ref 70–99)
Potassium: 4 mEq/L (ref 3.5–5.1)
SODIUM: 135 meq/L (ref 135–145)

## 2018-08-19 NOTE — Assessment & Plan Note (Signed)
Recheck BMP in office today and call with results

## 2018-08-19 NOTE — Addendum Note (Signed)
Addended by: Karlton Lemon on: 08/19/2018 09:40 AM   Modules accepted: Orders

## 2018-08-19 NOTE — Patient Instructions (Signed)
Glad you are better Will recheck BMP today - recheck creatinine  Continue Flonase Continue Claritin Work on healthy weight Will place referral for PCP Follow up with Dr. Kendrick Fries in 3 months or sooner if needed

## 2018-08-19 NOTE — Assessment & Plan Note (Signed)
Patient presents for ED follow-up for kidney stone.  He does not have a PCP and would like a referral to be set up with a PCP.  We discussed that his creatinine was elevated in the ED and will recheck a BMP in the office today.  We also discussed that his blood sugar has been elevated the last 2 checks and his blood pressure was borderline today.  Discussed that it would be good for him to get in with a PCP and have a physical.  Placed a referral for PCP today.  Patient agrees and will see them as soon as he can get an appointment.  In the meantime, please work on [employer] and healthy diet.  Please stay well-hydrated.  Patient Instructions  Glad you are better Will recheck BMP today - recheck creatinine  Continue Flonase Continue Claritin Work on healthy weight Will place referral for PCP Follow up with Dr. Kendrick Fries in 3 months or sooner if needed

## 2018-10-08 ENCOUNTER — Ambulatory Visit: Payer: Self-pay | Admitting: Pulmonary Disease

## 2018-10-24 ENCOUNTER — Ambulatory Visit: Payer: Self-pay | Admitting: Pulmonary Disease

## 2018-12-30 IMAGING — CT CT CHEST SUPER D W/O CM
2 of 4 series · 15 of 36 positions shown, 18 images · non-contrast
Comparison: Plain films of 09/18/2017

CLINICAL DATA: Enlarged lymph nodes on  plain film.

EXAM:
CT CHEST WITHOUT CONTRAST
TECHNIQUE: Multidetector CT imaging of the chest was performed using thin slice
collimation for electromagnetic bronchoscopy planning purposes,
without intravenous contrast.

[Series 4: thins · axial · 0.82mm/px · z∈[-319,-44]mm · 12 of 386 slices shown, 15 images]
[im 21/386  mediastinal]
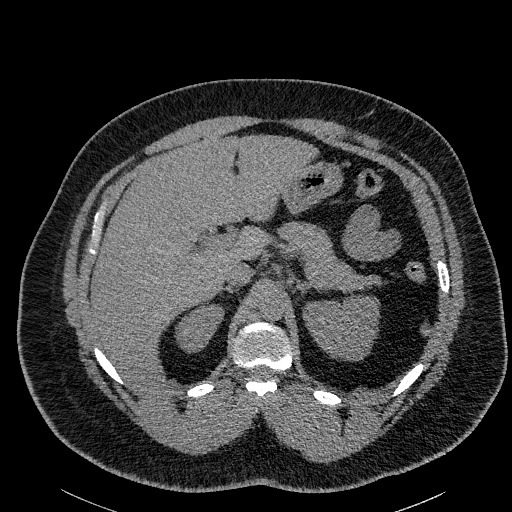
[im 21/386  lung]
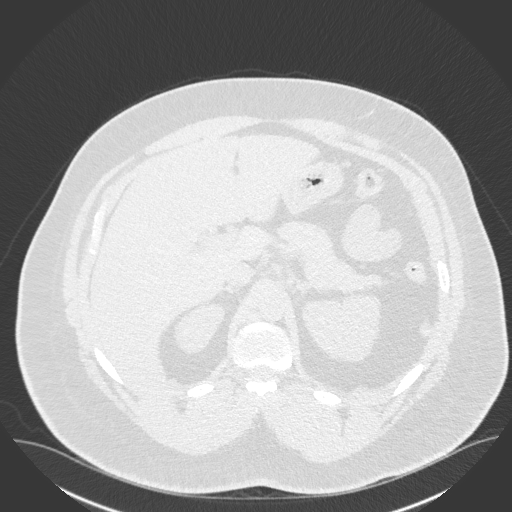
[im 61/386  lung]
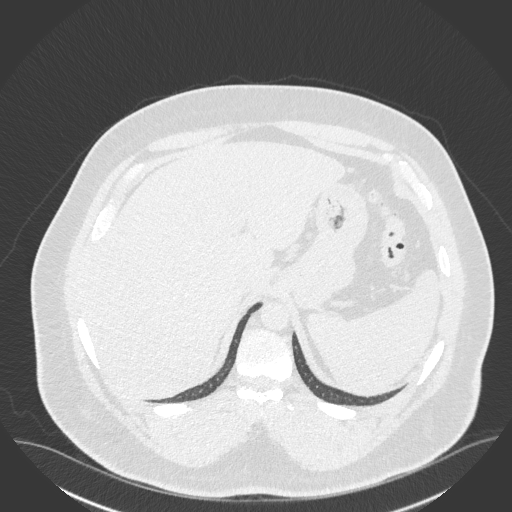
[im 82/386  lung]
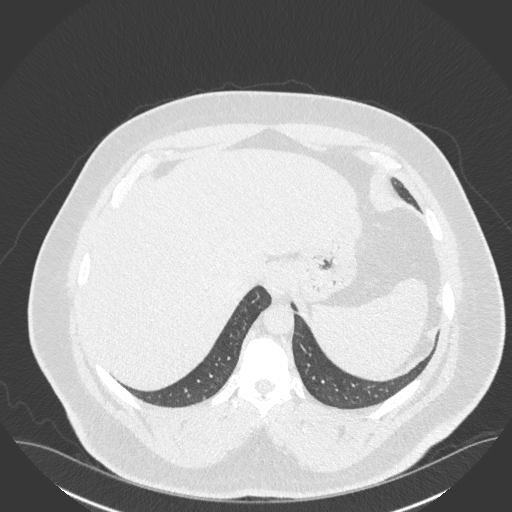
[im 122/386  lung]
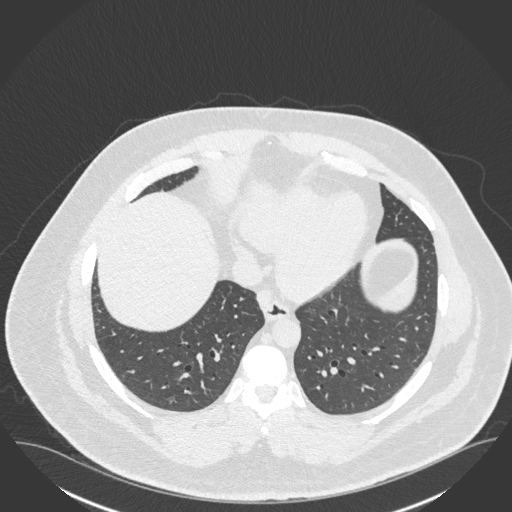
[im 142/386  mediastinal]
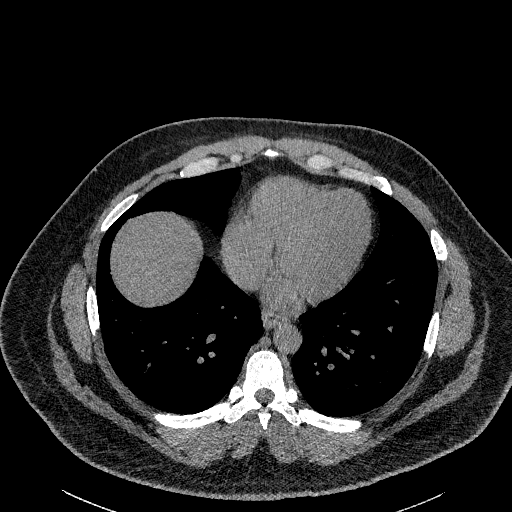
[im 142/386  lung]
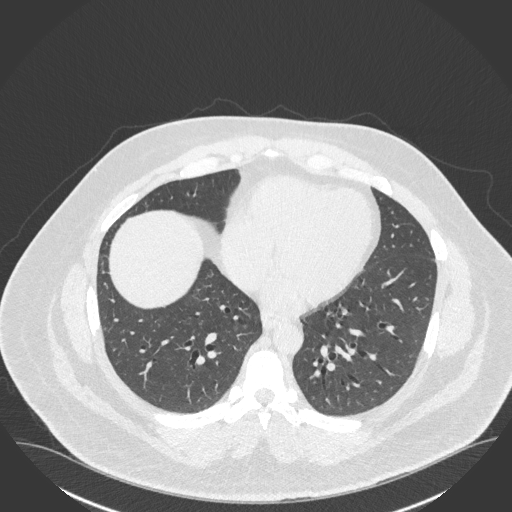
[im 183/386  lung]
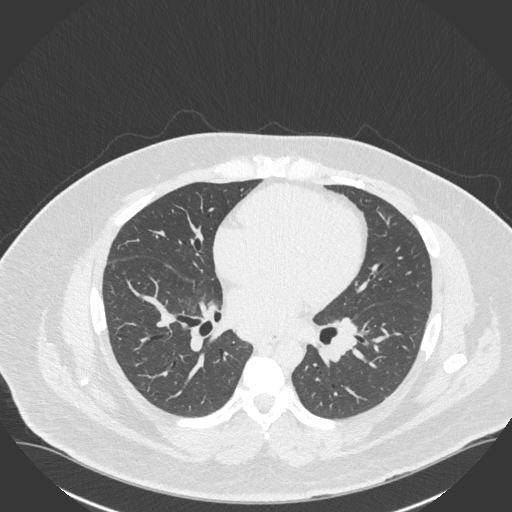
[im 203/386  lung]
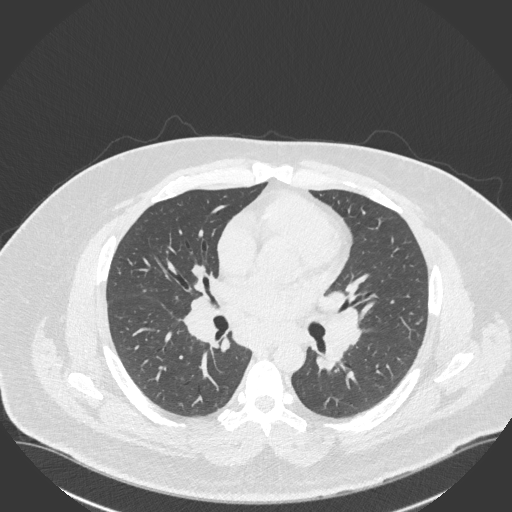
[im 244/386  lung]
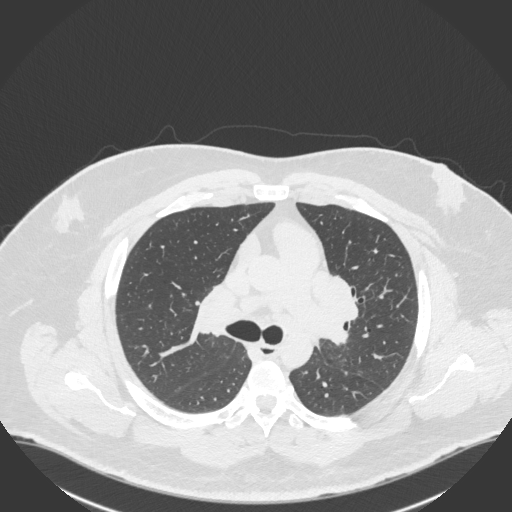
[im 264/386  mediastinal]
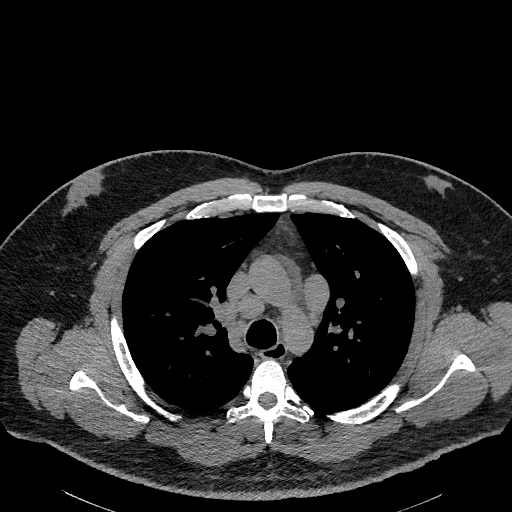
[im 264/386  lung]
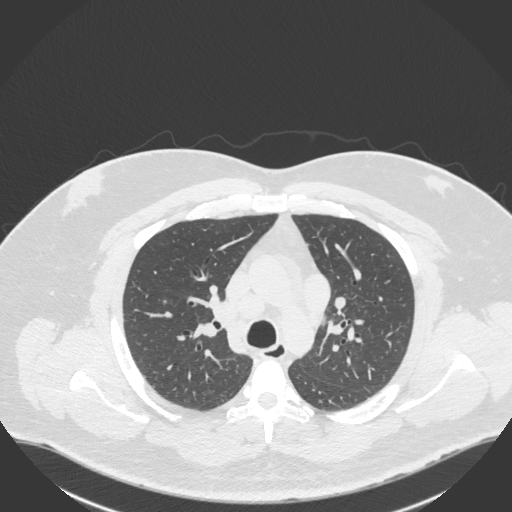
[im 304/386  lung]
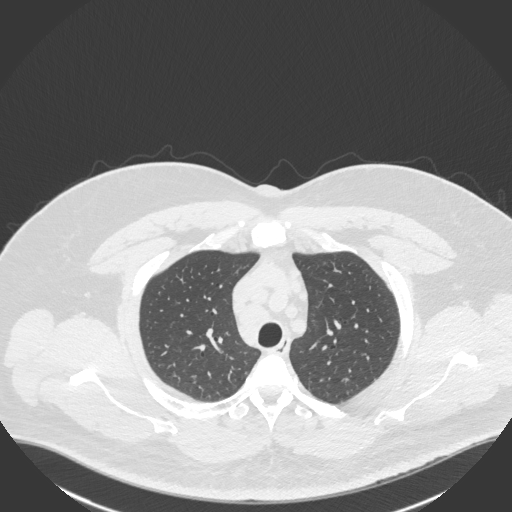
[im 325/386  lung]
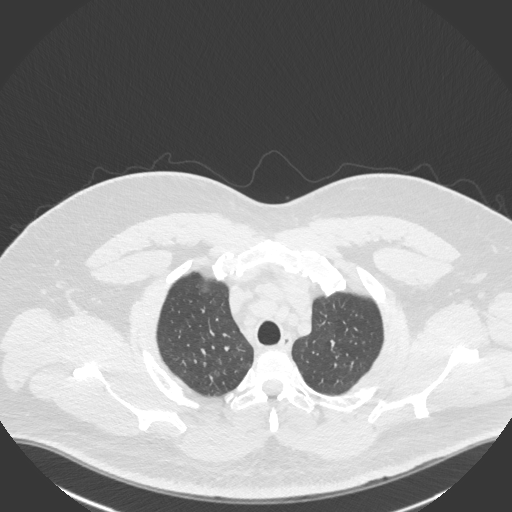
[im 365/386  lung]
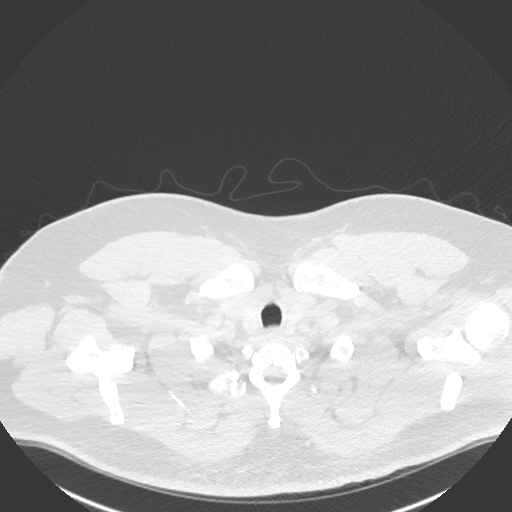

[Series 5: coronal · coronal · 0.60mm/px · 3 of 87 slices shown]
[im 18/87  lung]
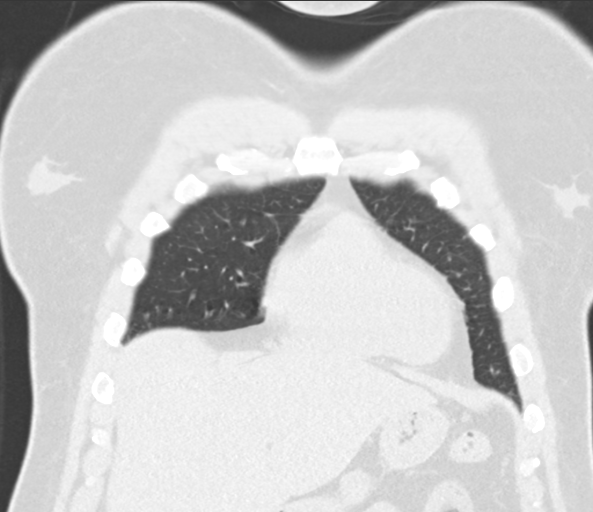
[im 35/87  lung]
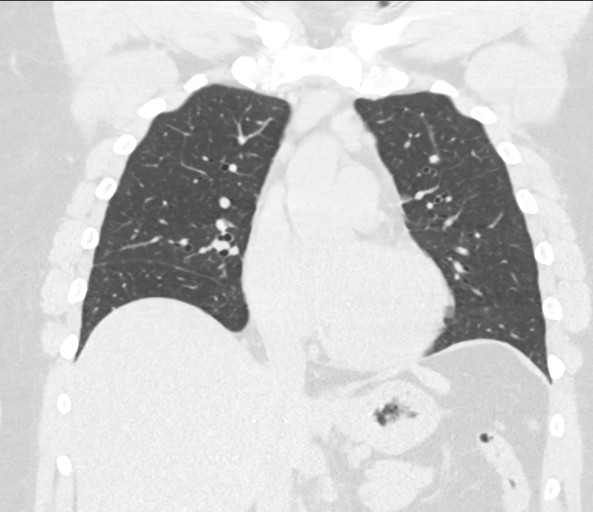
[im 52/87  lung]
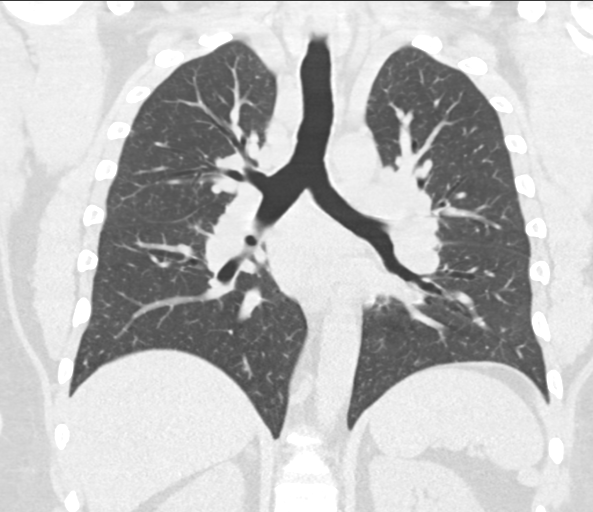

[15 of 36 positions shown; findings below may reference images not displayed]

FINDINGS: Cardiovascular: Normal aortic caliber. Normal heart size, without
pericardial effusion.

Mediastinum/Nodes: No axillary adenopathy. Right paratracheal node
measures 1.6 cm on image 36/2. Subcarinal adenopathy at 2.6 cm on
image 73/2.

Right hilar adenopathy is difficult to differentiate from adjacent
vasculature on this noncontrast exam. On the order of 2.8 cm on
image 66/2. Prevascular nodes of up to 2.0 cm on image 51/2.

Lungs/Pleura: No pleural fluid. Subtle upper lobe predominant
nodularity, most apparent in the left upper lobe and apex. Largest
nodules include at 2 mm in the right upper lobe on image 49/3 and 3
mm in the anterior right upper lobe on image 52/3. Subtle nodularity
identified along the fissures, including along the right minor
fissure on image 87/3.

Upper Abdomen: Normal imaged portions of the liver, spleen, stomach,
pancreas, adrenal glands, kidneys.

Musculoskeletal: Moderate left and moderate to severe right-sided
gynecomastia. No acute osseous abnormality.
IMPRESSION: 1. Extensive thoracic adenopathy. Especially given pulmonary
parenchymal findings, including upper lobe and perifissural
nodularity, sarcoidosis is strongly favored. Sequelae of prior
atypical infection or lymphoproliferative process felt much less
likely.
2. Right greater than left gynecomastia.

## 2019-01-20 IMAGING — DX DG CHEST 1V PORT
1 series · 1 of 1 positions shown · non-contrast
Comparison: Chest CT 10/02/2017 and earlier.

CLINICAL DATA: 37-year-old male status post bronchoscopy and
bronchoscopic biopsy. Thoracic lymphadenopathy and pulmonary
nodularity suspicious for sarcoidosis.

EXAM:
PORTABLE CHEST 1 VIEW

[chest ap]
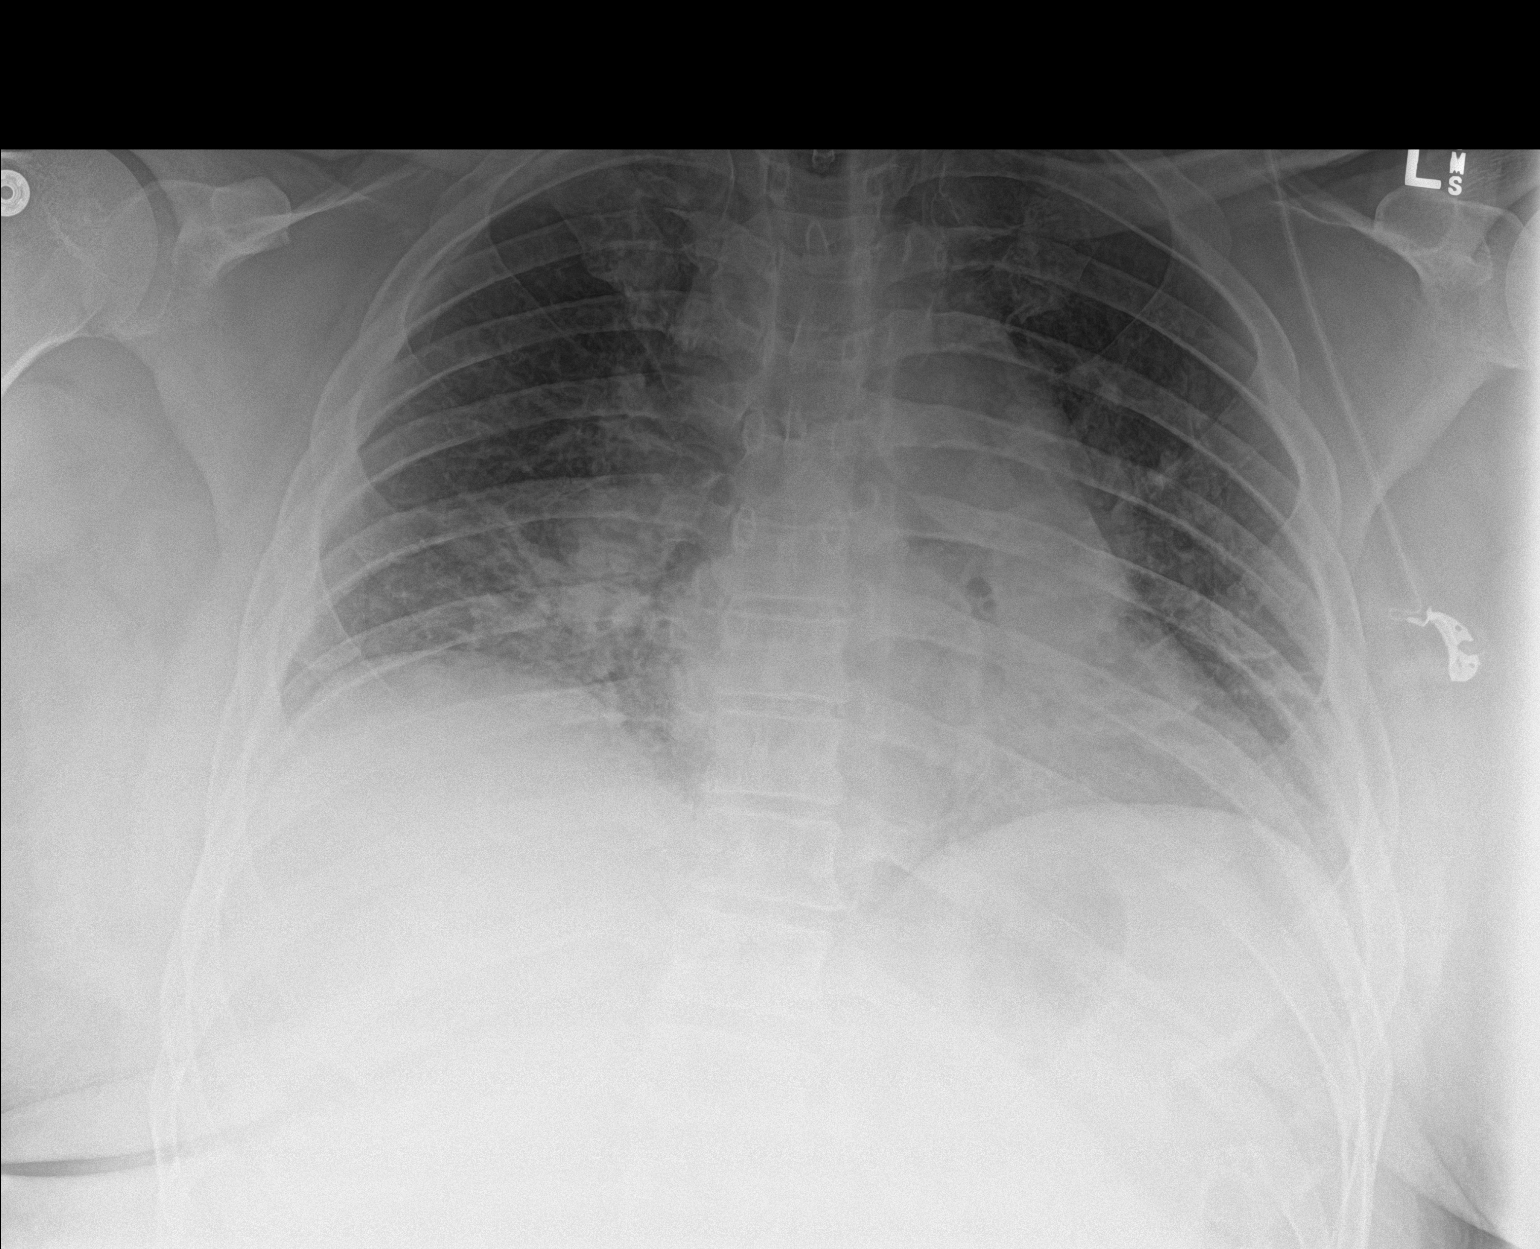

[1 of 1 positions shown; findings below may reference images not displayed]

FINDINGS: Portable AP semi upright view at 1696 hours. Lower lung volumes. No
pneumothorax, pulmonary edema, pleural effusion or consolidation.
Mild lung base atelectasis similar to prior chest radiographs.

No other confluent pulmonary opacity. Stable cardiac size and
mediastinal contours. Visualized tracheal air column is within
normal limits. Negative visible bowel gas pattern. No acute osseous
abnormality identified.
IMPRESSION: Stable chest. No pneumothorax or adverse features status post
bronchoscopy.

## 2019-11-26 ENCOUNTER — Ambulatory Visit (INDEPENDENT_AMBULATORY_CARE_PROVIDER_SITE_OTHER): Payer: BLUE CROSS/BLUE SHIELD

## 2019-11-26 ENCOUNTER — Encounter: Payer: Self-pay | Admitting: Adult Health

## 2019-11-26 ENCOUNTER — Other Ambulatory Visit: Payer: Self-pay

## 2019-11-26 ENCOUNTER — Ambulatory Visit (INDEPENDENT_AMBULATORY_CARE_PROVIDER_SITE_OTHER): Payer: BLUE CROSS/BLUE SHIELD | Admitting: Adult Health

## 2019-11-26 VITALS — BP 140/90 | HR 77 | Temp 98.3°F | Ht 73.0 in | Wt 323.0 lb

## 2019-11-26 DIAGNOSIS — D869 Sarcoidosis, unspecified: Secondary | ICD-10-CM | POA: Diagnosis not present

## 2019-11-26 NOTE — Progress Notes (Signed)
@Patient  ID: Paul Thomas, male    DOB: [birth year], 39 y.o.   MRN: [id number]  Chief Complaint  Patient presents with  . Follow-up    sarcoid     Referring provider: Juanito Doom, MD  HPI: 39 year old never smoker seen for pulmonary consult April 2019 for abnormal CT chest with nodularity and thoracic adenopathy underwent bronchoscopy April 2019 with positive nonnecrotizing granuloma consistent with sarcoidosis  TEST/EVENTS :  10/02/2017-CT super D chest without contrast-extensive thoracic adenopathy, especially given pulmonary parenchymal findings including upper lobe nodularity, subtle nodularity along the fissures,  sarcoidosis is strongly favored, right greater than left gynecomastia.  Micro:  10/23/2017-elective bronchoscopy 10/23/2017-fungal organism no growth 10/23/2017-fungal culture-no fungus observed 09/26/2017-AFB- negative 10/23/17-culture respiratory-few WBC present, predominantly mononuclear  10/23/2017 surgical path benign bronchial mucosa and lung tissue with small nonnecrotizing granulomata, no evidence of malignancy Cytology negative for malignant cells granulomas present  December 24, 2017 PFTs FEV1 88%, ratio 91, FVC 80%, DLCO 89%.  No significant bronchodilator response.  April 2019 LFTs normal  11/26/2019 Follow up: Sarcoid  Patient presents for a follow-up visit last seen June 2019.  Patient was initially seen for pulmonary consult for abnormal CT chest that showed upper lobe nodularity and extensive thoracic adenopathy.  He underwent bronchoscopy in April 2019 that showed cytology and pathology positive for granulomas.  Felt to be consistent with sarcoidosis.  Pulmonary function testing showed normal lung function and normal diffusing capacity. Patient says since last visit he has been doing well.  He denies any cough or shortness of breath.  Says he is very active.  He denies any rash.  No visual changes.  Gets yearly eye exams.  Patient does not have a primary  care provider.  And would like a referral today.    No Known Allergies   There is no immunization history on file for this patient.  Past Medical History:  Diagnosis Date  . GERD (gastroesophageal reflux disease)   . Heart murmur     ????when in high school during [place] physical- never saw a cardiologist (no problem)  . History of kidney stones     Tobacco History: Social History   Tobacco Use  Smoking Status Never Smoker  Smokeless Tobacco Never Used   Counseling given: Not Answered   Outpatient Medications Prior to Visit  Medication Sig Dispense Refill  . omeprazole (PRILOSEC OTC) 20 MG tablet Take 20 mg by mouth daily.    . fluticasone (FLONASE) 50 MCG/ACT nasal spray Place 2 sprays into both nostrils daily. 16 g 3   No facility-administered medications prior to visit.     Review of Systems:   Constitutional:   No  weight loss, night sweats,  Fevers, chills, fatigue, or  lassitude.  HEENT:   No headaches,  Difficulty swallowing,  Tooth/dental problems, or  Sore throat,                No sneezing, itching, ear ache, nasal congestion, post nasal drip,   CV:  No chest pain,  Orthopnea, PND, swelling in lower extremities, anasarca, dizziness, palpitations, syncope.   GI  No heartburn, indigestion, abdominal pain, nausea, vomiting, diarrhea, change in bowel habits, loss of appetite, bloody stools.   Resp: No shortness of breath with exertion or at rest.  No excess mucus, no productive cough,  No non-productive cough,  No coughing up of blood.  No change in color of mucus.  No wheezing.  No chest wall deformity  Skin: no rash or  lesions.  GU: no dysuria, change in color of urine, no urgency or frequency.  No flank pain, no hematuria   MS:  No joint pain or swelling.  No decreased range of motion.  No back pain.    Physical Exam  BP 140/90 (BP Location: Left Arm, Cuff Size: Large)   Pulse 77   Temp 98.3 F (36.8 C) (Oral)   Ht 6\' 1"  (1.854 m)   Wt (!) 323  lb (146.5 kg)   SpO2 99%   BMI 42.61 kg/m   GEN: A/Ox3; pleasant , NAD, well nourished    HEENT:  [place]/AT,   NOSE-clear, THROAT-clear, no lesions, no postnasal drip or exudate noted.   NECK:  Supple w/ fair ROM; no JVD; normal carotid impulses w/o bruits; no thyromegaly or nodules palpated; no lymphadenopathy.    RESP  Clear  P & A; w/o, wheezes/ rales/ or rhonchi. no accessory muscle use, no dullness to percussion  CARD:  RRR, no m/r/g, no peripheral edema, pulses intact, no cyanosis or clubbing.  GI:   Soft & nt; nml bowel sounds; no organomegaly or masses detected.   Musco: Warm bil, no deformities or joint swelling noted.   Neuro: alert, no focal deficits noted.    Skin: Warm, no lesions or rashes     BNP No results found for: BNP  ProBNP No results found for: PROBNP  Imaging: DG Chest 2 View  Result Date: 11/26/2019 CLINICAL DATA:  Sarcoidosis EXAM: CHEST - 2 VIEW COMPARISON:  10/23/2017 FINDINGS: Normal heart size, mediastinal contours, and pulmonary vascularity. Minimal peribronchial thickening and accentuation of perihilar markings. No infiltrate, pleural effusion or pneumothorax. Osseous structures unremarkable. IMPRESSION: Bronchitic changes with chronic interstitial prominence. Improved bibasilar atelectasis since prior study. Electronically Signed   By: 10/25/2017 M.D.   On: 11/26/2019 10:42      PFT Results Latest Ref Rng & Units 12/24/2017  FVC-Pre L 3.67  FVC-Predicted Pre % 83  FVC-Post L 3.54  FVC-Predicted Post % 80  Pre FEV1/FVC % % 87  Post FEV1/FCV % % 91  FEV1-Pre L 3.20  FEV1-Predicted Pre % 88  FEV1-Post L 3.22  DLCO UNC% % 89  DLCO COR %Predicted % 133  TLC L 6.65  TLC % Predicted % 96  RV % Predicted % 185    No results found for: NITRICOXIDE      Assessment & Plan:   Sarcoidosis Abnormal CT chest with upper lobe nodularity and thoracic adenopathy.  Underwent bronchoscopy April 2019 with pathology and cytology positive for  granulomas.  Felt consistent with sarcoidosis. Pulmonary function test that showed normal lung function and normal diffusing capacity.  Patient is asymptomatic. Patient education given.  Will check ACE level today.  Check chest x-ray today.  Have referred him to primary care provider to establish.  He is to continue with yearly eye exams. Follow-up in 6 months.  Pending his chest x-ray results visit will decide on next pulmonary function testing  Plan  Patient Instructions  Sarcoidosis  Chest chest xray today  ACE level .  Yearly eye exams.  Remain active, healthy diet.  Refer to Primary Care Provider.  Recommend Covid vaccine  Follow up with Dr. May 2019 In 6 months and As needed             Melida Quitter, NP 11/26/2019

## 2019-11-26 NOTE — Patient Instructions (Addendum)
Sarcoidosis  Chest chest xray today  ACE level .  Yearly eye exams.  Remain active, healthy diet.  Refer to Primary Care Provider.  Recommend Covid vaccine  Follow up with Dr. Melida Quitter In 6 months and As needed

## 2019-11-26 NOTE — Assessment & Plan Note (Signed)
Abnormal CT chest with upper lobe nodularity and thoracic adenopathy.  Underwent bronchoscopy April 2019 with pathology and cytology positive for granulomas.  Felt consistent with sarcoidosis. Pulmonary function test that showed normal lung function and normal diffusing capacity.  Patient is asymptomatic. Patient education given.  Will check ACE level today.  Check chest x-ray today.  Have referred him to primary care provider to establish.  He is to continue with yearly eye exams. Follow-up in 6 months.  Pending his chest x-ray results visit will decide on next pulmonary function testing  Plan  Patient Instructions  Sarcoidosis  Chest chest xray today  ACE level .  Yearly eye exams.  Remain active, healthy diet.  Refer to Primary Care Provider.  Recommend Covid vaccine  Follow up with Dr. Melida Quitter In 6 months and As needed

## 2019-11-28 LAB — ANGIOTENSIN CONVERTING ENZYME: Angiotensin-Converting Enzyme: 30 U/L (ref 9–67)

## 2020-02-25 ENCOUNTER — Telehealth: Payer: Self-pay | Admitting: Adult Health

## 2020-02-25 NOTE — Telephone Encounter (Signed)
Called and spoke with pt. Pt stated that he is needing order to be placed for primary care. Order was placed after pt's last visit with TP 11/26/19 for pt to be referred to primary care.  [facility], please advise.

## 2020-02-25 NOTE — Telephone Encounter (Signed)
Attempted to call pt but unable to reach and unable to leave VM due to mailbox not being set up. Will try to call back later. 

## 2020-02-25 NOTE — Telephone Encounter (Signed)
Pt returning a phone call. Pt can be reached at [phone]

## 2020-02-26 NOTE — Telephone Encounter (Signed)
Referral was sent to [facility] on 6/2.  I called them and spoke to [place].  She states they are out-of-network with pt's insurance - he has [employer] thru [facility] network.  She checked website & [facility] is in-network.  I called them & was able to get appt for pt on 9/28.  I called pt & made him aware of appt info.  Referral/records faxed to their office at [phone].  Nothing further needed.

## 2020-05-26 ENCOUNTER — Ambulatory Visit: Payer: BLUE CROSS/BLUE SHIELD | Admitting: Pulmonary Disease

## 2020-05-26 ENCOUNTER — Other Ambulatory Visit: Payer: Self-pay

## 2020-05-26 ENCOUNTER — Encounter: Payer: Self-pay | Admitting: Pulmonary Disease

## 2020-05-26 VITALS — BP 128/88 | HR 74 | Temp 98.0°F | Ht 73.0 in | Wt 314.0 lb

## 2020-05-26 DIAGNOSIS — D869 Sarcoidosis, unspecified: Secondary | ICD-10-CM | POA: Diagnosis not present

## 2020-05-26 DIAGNOSIS — Z0000 Encounter for general adult medical examination without abnormal findings: Secondary | ICD-10-CM | POA: Insufficient documentation

## 2020-05-26 DIAGNOSIS — J849 Interstitial pulmonary disease, unspecified: Secondary | ICD-10-CM | POA: Diagnosis not present

## 2020-05-26 DIAGNOSIS — R0789 Other chest pain: Secondary | ICD-10-CM

## 2020-05-26 NOTE — Assessment & Plan Note (Signed)
Plan: High-resolution CT chest ordered today Echocardiogram ordered today Walk today in office We will have patient establish with new pulmonologist in 4 to 6 weeks and a 30-minute time slot Continue to increase overall physical activity work to reduce BMI

## 2020-05-26 NOTE — Patient Instructions (Addendum)
You were seen today by Coral Ceo, NP  for:   1. ILD (interstitial lung disease) (HCC) 2. Sarcoidosis  - ECHOCARDIOGRAM COMPLETE; Future - CT Chest High Resolution; Future  Walk today in office stable  We will order an echocardiogram which will evaluate the structural pumping of your heart as well as if there is elevated pulmonary pressures  As discussed today if there are elevated pulmonary pressures we would likely recommend a sleep study  We will order a high-resolution CT chest to further evaluate your chest wall  3. Healthcare maintenance  Recommend seasonal flu vaccine  Recommend you continue follow-up with Raenette Rover, PA-C with [facility] primary care  4. Atypical chest pain  - ECHOCARDIOGRAM COMPLETE; Future  We will order an echocardiogram to evaluate this atypical chest pain  Would recommend that you consider starting to take ibuprofen 600 mg every 6-8 hours for the next 7 to 10 days.  See if this helps with this atypical chest pain  If chest pain persists an echocardiogram is normal will need to consider referral to cardiology  We recommend today:  Orders Placed This Encounter  Procedures  . CT Chest High Resolution    Standing Status:   Future    Standing Expiration Date:   05/26/2021    Scheduling Instructions:     -High-res CT with supine and prone positioning     -inspiratory and expiratory cuts.      -Only to be read by Dr. Fredirick Lathe and Dr. Llana Aliment.    Order Specific Question:   Preferred imaging location?    Answer:   [place] [facility] - [address]  . ECHOCARDIOGRAM COMPLETE    Standing Status:   Future    Standing Expiration Date:   11/24/2020    Order Specific Question:   Where should this test be performed    Answer:   [facility])    Order Specific Question:   Does the patient weigh less than or greater than 250 lbs?    Answer:   Patient weighs greater than 250 lbs    Order Specific Question:   Perflutren DEFINITY (image  enhancing agent) should be administered unless hypersensitivity or allergy exist    Answer:   Administer Perflutren    Order Specific Question:   Reason for exam-Echo    Answer:   Dyspnea  786.09 / R06.00    Order Specific Question:   Other Comments    Answer:   r/o pulm htn, known sarcoid, atypical chest pain   Orders Placed This Encounter  Procedures  . CT Chest High Resolution  . ECHOCARDIOGRAM COMPLETE   No orders of the defined types were placed in this encounter.   Follow Up:    Return in about 6 weeks (around 07/07/2020), or if symptoms worsen or fail to improve, for NEW PULMONOLOGIST IN SLOT. Prefer establishing with pulmonologist and 30-minute time slot-recommendations would be Dr. Celine Mans, Dr. Isaiah Serge, Dr. Francine Graven or Dr. Judeth Horn    Notification of test results are managed in the following manner: If there are  any recommendations or changes to the  plan of care discussed in office today,  we will contact you and let you know what they are. If you do not hear from [place], then your results are normal and you can view them through your  MyChart account , or a letter will be sent to you. Thank you again for trusting [place] with your care  - Thank you, [facility]    It is flu season:   >>> Best ways to protect herself from the flu: Receive the yearly flu vaccine, practice good hand hygiene washing with soap and also using hand sanitizer when available, eat a nutritious meals, get adequate rest, hydrate appropriately       Please contact the office if your symptoms worsen or you have concerns that you are not improving.   Thank you for choosing [facility] for your healthcare, and for allowing [place] to partner with you on your healthcare journey. I am thankful to be able to provide care to you today.   Elisha Headland FNP-C

## 2020-05-26 NOTE — Progress Notes (Signed)
@Patient  ID: Paul Thomas, male    DOB: 12/16/1980, 39 y.o.   MRN: [id number]  Chief Complaint  Patient presents with   Follow-up    Discomfort on left side of chest for last couple month, feels like breathing is good    Referring provider: [id number], MD  HPI:  39 year old male never smoker followed in our office for mediastinal lymphadenopathy, sarcoidosis, suspected obstructive sleep apnea  PMH: Atypical chest pain, allergic rhinitis, history of kidney stones Smoker/ Smoking History: Never smoker Maintenance: None Pt of: Dr. 41  05/26/2020  - Visit   39 year old male never smoker found our office for sarcoidosis.  Patient was last seen in our office in June/2021 by TP NP.  He is established with Dr. July/2021.  Plan of care at last office visit in June/2021 was for him to obtain a chest x-ray as well as an ACE level.  He was encouraged to keep yearly eye exams.  Is recommended that he establish care with Dr. July/2021 in 6 months.  Patient presented today for that follow-up.  Patient reporting that he has had atypical chest pain for the last year.  He reports this is left rib/chest wall pain.  It is a daily pain.  He is unsure if it is musculoskeletal.  He has not sought acute treatment for this.  He does not feel the symptoms have worsened but it continues to persist.  He has been working out more working on reducing his weight.  Weight is down from June/2021 office visit.  Patient denies acute worsening shortness of breath, wheezing, cough.  He is currently established with Dr. July/2021 as an outpatient pulmonologist.  We will get him established with a different pulmonologist in our practice as Dr. Kendrick Fries is now working formally in the inpatient setting.  Patient is established with [facility] primary care saw [facility], PA-C.  Stable walk in office today, completed 3 laps on room air with no oxygen desaturations or tachycardia.  Questionaires / Pulmonary Flowsheets:    ACT:  No flowsheet data found.  MMRC: mMRC Dyspnea Scale mMRC Score  05/26/2020 1    Epworth:  No flowsheet data found.  Tests:   11/26/2019-chest x-ray-bronchitic changes with chronic interstitial prominence, improved bibasilar atelectasis  11/26/2019-ACE level-30  10/02/2017-CT super D chest without contrast-extensive thoracic adenopathy, especially given pulmonary parenchymal findings including upper lobe nodularity, subtle nodularity along the fissures,  sarcoidosis is strongly favored, right greater than left gynecomastia.  Micro:  10/23/2017-elective bronchoscopy 10/23/2017-fungal organism no growth 10/23/2017-fungal culture-no fungus observed 09/26/2017-AFB- negative 10/23/17-culture respiratory-few WBC present, predominantly mononuclear  10/23/2017 surgical path benign bronchial mucosa and lung tissue with small nonnecrotizing granulomata, no evidence of malignancy Cytology negative for malignant cells granulomas present  December 24, 2017 PFTs FEV1 88%, ratio 91, FVC 80%, DLCO 89%.  No significant bronchodilator response.  April 2019 LFTs normal   FENO:  No results found for: NITRICOXIDE  PFT: PFT Results Latest Ref Rng & Units 12/24/2017  FVC-Pre L 3.67  FVC-Predicted Pre % 83  FVC-Post L 3.54  FVC-Predicted Post % 80  Pre FEV1/FVC % % 87  Post FEV1/FCV % % 91  FEV1-Pre L 3.20  FEV1-Predicted Pre % 88  FEV1-Post L 3.22  DLCO uncorrected ml/min/mmHg 28.98  DLCO UNC% % 89  DLVA Predicted % 133  TLC L 6.65  TLC % Predicted % 96  RV % Predicted % 185    WALK:  SIX MIN WALK 05/26/2020  Supplimental Oxygen during  Test? (L/min) No  Tech Comments: Patient walked average pace, completed 3 laps without stopping, maintained good sats, no complaints of shortness of breath    Imaging: No results found.  Lab Results:  CBC    Component Value Date/Time   WBC 8.0 08/06/2018 1847   RBC 5.13 08/06/2018 1847   HGB 13.1 08/06/2018 1847   HCT 42.6 08/06/2018 1847   PLT 359  08/06/2018 1847   MCV 83.0 08/06/2018 1847   MCH 25.5 (L) 08/06/2018 1847   MCHC 30.8 08/06/2018 1847   RDW 14.9 08/06/2018 1847   LYMPHSABS 0.6 (L) 08/06/2018 1847   MONOABS 1.0 08/06/2018 1847   EOSABS 0.1 08/06/2018 1847   BASOSABS 0.0 08/06/2018 1847    BMET    Component Value Date/Time   NA 135 08/19/2018 0931   K 4.0 08/19/2018 0931   CL 101 08/19/2018 0931   CO2 26 08/19/2018 0931   GLUCOSE 104 (H) 08/19/2018 0931   BUN 16 08/19/2018 0931   CREATININE 1.19 08/19/2018 0931   CALCIUM 9.2 08/19/2018 0931   GFRNONAA 58 (L) 08/06/2018 1847   GFRAA >60 08/06/2018 1847    BNP No results found for: BNP  ProBNP No results found for: PROBNP  Specialty Problems      Pulmonary Problems   ILD (interstitial lung disease) (HCC)   Allergic rhinitis   Bronchitis, acute   Sore throat   Upper respiratory infection, acute      No Known Allergies  Immunization History  Administered Date(s) Administered   PFIZER SARS-COV-2 Vaccination 03/04/2020, 03/31/2020    Past Medical History:  Diagnosis Date   GERD (gastroesophageal reflux disease)    Heart murmur     ????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   [DISCONTINUED] fluticasone (FLONASE) 50 MCG/ACT nasal spray Place 2 sprays into both nostrils daily.   No facility-administered encounter medications on file as of 05/26/2020.     Review of Systems  Review of Systems  Constitutional: Negative for activity change, chills, fatigue, fever and unexpected weight change.  HENT: Negative for postnasal drip, rhinorrhea, sinus pressure, sinus  pain and sore throat.   Eyes: Negative.   Respiratory: Negative for cough, shortness of breath and wheezing.   Cardiovascular: Positive for chest pain (left chest wall pain for 1 year). Negative for palpitations.  Gastrointestinal: Negative for constipation, diarrhea, nausea and vomiting.  Endocrine: Negative.   Genitourinary: Negative.   Musculoskeletal: Negative.   Skin: Negative.   Neurological: Negative for dizziness and headaches.  Psychiatric/Behavioral: Negative.  Negative for dysphoric mood. The patient is not nervous/anxious.   All other systems reviewed and are negative.    Physical Exam  BP 128/88 (BP Location: Left Arm, Patient Position: Sitting, Cuff Size: Large)    Pulse 74    Temp 98 F (36.7 C) (Temporal)    Ht 6\' 1"  (1.854 m)    Wt (!) 314 lb (142.4 kg)    SpO2 97%    BMI 41.43 kg/m   Wt Readings from Last 5 Encounters:  05/26/20 (!) 314 lb (142.4 kg)  11/26/19 (!) 323  lb (146.5 kg)  08/19/18 296 lb (134.3 kg)  11/29/17 (!) 302 lb (137 kg)  11/26/17 (!) 301 lb (136.5 kg)    BMI Readings from Last 5 Encounters:  05/26/20 41.43 kg/m  11/26/19 42.61 kg/m  08/19/18 39.05 kg/m  11/29/17 39.84 kg/m  11/26/17 39.71 kg/m     Physical Exam Vitals and nursing note reviewed.  Constitutional:      General: He is not in acute distress.    Appearance: Normal appearance. He is obese.  HENT:     Head: Normocephalic and atraumatic.     Right Ear: Hearing and external ear normal.     Left Ear: Hearing and external ear normal.     Nose: Nose normal. No mucosal edema or rhinorrhea.     Right Turbinates: Not enlarged.     Left Turbinates: Not enlarged.     Mouth/Throat:     Mouth: Mucous membranes are dry.     Pharynx: Oropharynx is clear. No oropharyngeal exudate.  Eyes:     Pupils: Pupils are equal, round, and reactive to light.  Cardiovascular:     Rate and Rhythm: Normal rate and regular rhythm.     Pulses: Normal pulses.     Heart sounds: Normal heart  sounds. No murmur heard.   Pulmonary:     Effort: Pulmonary effort is normal.     Breath sounds: Normal breath sounds. No decreased breath sounds, wheezing or rales.  Abdominal:     General: Bowel sounds are normal. There is no distension.     Palpations: Abdomen is soft.     Tenderness: There is no abdominal tenderness.  Musculoskeletal:     Cervical back: Normal range of motion.     Right lower leg: No edema.     Left lower leg: No edema.  Lymphadenopathy:     Cervical: No cervical adenopathy.  Skin:    General: Skin is warm and dry.     Capillary Refill: Capillary refill takes less than 2 seconds.     Findings: No erythema or rash.  Neurological:     General: No focal deficit present.     Mental Status: He is alert and oriented to person, place, and time.     Motor: No weakness.     Coordination: Coordination normal.     Gait: Gait is intact. Gait normal.  Psychiatric:        Mood and Affect: Mood normal.        Behavior: Behavior normal. Behavior is cooperative.        Thought Content: Thought content normal.        Judgment: Judgment normal.       Assessment & Plan:   ILD (interstitial lung disease) (HCC) Plan: High-resolution CT chest ordered today Echocardiogram ordered today Walk today in office We will have patient establish with new pulmonologist in 4 to 6 weeks and a 30-minute time slot Continue to increase overall physical activity work to reduce BMI  Atypical chest pain Unsure what to make of this atypical chest pain Symptoms have been present for around a year per the patient He is not currently established with a cardiologist Has been evaluated with primary care as well as our office previously with similar symptoms It does sound more musculoskeletal in nature Given known sarcoidosis and no previous baseline echocardiogram we will proceed forward with this Stable chest x-ray in June/2021 but no recent CT imaging since 2012  Plan: Echocardiogram  ordered Walk today in office  High-resolution CT chest ordered Close follow-up established with pulmonologist in our practice  Sarcoidosis Status post bronchoscopy in April/2012 with Dr. Kendrick Fries Pathology and cytology positive for granulomas Felt to be consistent with sarcoidosis Normal pulmonary function testing in 2019 June/2021 chest x-ray stable Last CT imaging 2012 Last ACE level in June/2021 was normal Walk today in office stable Patient with atypical chest wall pain -for 1 year No previous echocardiogram  Plan: Continue yearly eye exams Walk today in office High resolution CT chest ordered Complete echocardiogram May need to consider sleep study based on his echocardiogram results if pulmonary hypertension is suspected Continue to work on reducing BMI Recommend seasonal flu vaccine, patient declines at this time he reports that he will think about it 4 to 6-week follow-up in a 30-minute time slot with a new pulmonologist as he is a previous Dr. Kendrick Fries patient   Healthcare maintenance  Plan: Recommend seasonal flu vaccine, patient declines at this time Patient reports he will think about obtaining the seasonal flu vaccine he will let us know if he has any issues obtaining it Remain established with [facility] primary care Continue to work on reducing BMI    Return in about 6 weeks (around 07/07/2020), or if symptoms worsen or fail to improve, for NEW PULMONOLOGIST IN SLOT.   Coral Ceo, NP 05/26/2020   This appointment required 35 minutes of patient care (this includes precharting, chart review, review of results, face-to-face care, etc.).

## 2020-05-26 NOTE — Assessment & Plan Note (Signed)
  Plan: Recommend seasonal flu vaccine, patient declines at this time Patient reports he will think about obtaining the seasonal flu vaccine he will let us know if he has any issues obtaining it Remain established with [facility] primary care Continue to work on reducing BMI

## 2020-05-26 NOTE — Assessment & Plan Note (Signed)
Unsure what to make of this atypical chest pain Symptoms have been present for around a year per the patient He is not currently established with a cardiologist Has been evaluated with primary care as well as our office previously with similar symptoms It does sound more musculoskeletal in nature Given known sarcoidosis and no previous baseline echocardiogram we will proceed forward with this Stable chest x-ray in June/2021 but no recent CT imaging since 2012  Plan: Echocardiogram ordered Walk today in office High-resolution CT chest ordered Close follow-up established with pulmonologist in our practice

## 2020-05-26 NOTE — Assessment & Plan Note (Signed)
Status post bronchoscopy in April/2012 with Dr. Kendrick Fries Pathology and cytology positive for granulomas Felt to be consistent with sarcoidosis Normal pulmonary function testing in 2019 June/2021 chest x-ray stable Last CT imaging 2012 Last ACE level in June/2021 was normal Walk today in office stable Patient with atypical chest wall pain -for 1 year No previous echocardiogram  Plan: Continue yearly eye exams Walk today in office High resolution CT chest ordered Complete echocardiogram May need to consider sleep study based on his echocardiogram results if pulmonary hypertension is suspected Continue to work on reducing BMI Recommend seasonal flu vaccine, patient declines at this time he reports that he will think about it 4 to 6-week follow-up in a 30-minute time slot with a new pulmonologist as he is a previous Dr. Kendrick Fries patient

## 2020-06-11 ENCOUNTER — Other Ambulatory Visit: Payer: BLUE CROSS/BLUE SHIELD

## 2020-06-14 ENCOUNTER — Other Ambulatory Visit (HOSPITAL_COMMUNITY): Payer: BLUE CROSS/BLUE SHIELD

## 2020-06-15 ENCOUNTER — Other Ambulatory Visit: Payer: Self-pay

## 2020-06-15 ENCOUNTER — Ambulatory Visit (HOSPITAL_COMMUNITY)
Admission: RE | Admit: 2020-06-15 | Discharge: 2020-06-15 | Disposition: A | Discharge status: Home / Self Care | Payer: BLUE CROSS/BLUE SHIELD | Source: Ambulatory Visit | Attending: Pulmonary Disease | Admitting: Pulmonary Disease

## 2020-06-15 DIAGNOSIS — D869 Sarcoidosis, unspecified: Secondary | ICD-10-CM

## 2020-06-15 DIAGNOSIS — R0789 Other chest pain: Secondary | ICD-10-CM | POA: Diagnosis not present

## 2020-06-15 DIAGNOSIS — I340 Nonrheumatic mitral (valve) insufficiency: Secondary | ICD-10-CM | POA: Insufficient documentation

## 2020-06-15 DIAGNOSIS — I517 Cardiomegaly: Secondary | ICD-10-CM | POA: Insufficient documentation

## 2020-06-15 DIAGNOSIS — J849 Interstitial pulmonary disease, unspecified: Secondary | ICD-10-CM

## 2020-06-15 DIAGNOSIS — R0600 Dyspnea, unspecified: Secondary | ICD-10-CM | POA: Insufficient documentation

## 2020-06-15 LAB — ECHOCARDIOGRAM COMPLETE
Area-P 1/2: 2.76 cm2
S' Lateral: 2.9 cm

## 2020-06-15 NOTE — Progress Notes (Signed)
  Echocardiogram 2D Echocardiogram has been performed.  Augustine Radar 06/15/2020, 8:54 AM

## 2020-06-21 ENCOUNTER — Telehealth: Payer: Self-pay | Admitting: Pulmonary Disease

## 2020-06-21 NOTE — Telephone Encounter (Signed)
06/21/2020  Right ventricle-moderate to severely enlarged.  Left ventricle pumping well at 60 to 65%.  Right ventricle is likely directly related to patient's known interstitial lung disease.  This can be reviewed in more detail at next office visit.  Please ensure patient is scheduled in a 30-minute time slot to establish care with a new pulmonologist as he is a former Dr. Kendrick Fries patient.  Patient should proceed forward with a high-resolution CT chest as scheduled on 06/22/2020  Elisha Headland, FNP

## 2020-06-21 NOTE — Telephone Encounter (Signed)
Called spoke with let him know we would ask the provider if results were ready and would get back to him tomorrow 06/22/20  Arlys John please advise on ECHO results

## 2020-06-21 NOTE — Telephone Encounter (Signed)
Spoke with the Paul Thomas and notified of results and recommendations from Arlys John  He verbalized understanding  He states that he will keep appt with Dr Francine Graven for 07/06/20

## 2020-06-22 ENCOUNTER — Ambulatory Visit (INDEPENDENT_AMBULATORY_CARE_PROVIDER_SITE_OTHER)
Admission: RE | Admit: 2020-06-22 | Discharge: 2020-06-22 | Disposition: A | Discharge status: Home / Self Care | Payer: BLUE CROSS/BLUE SHIELD | Source: Ambulatory Visit | Attending: Pulmonary Disease | Admitting: Pulmonary Disease

## 2020-06-22 ENCOUNTER — Other Ambulatory Visit: Payer: Self-pay

## 2020-06-22 DIAGNOSIS — J849 Interstitial pulmonary disease, unspecified: Secondary | ICD-10-CM | POA: Diagnosis not present

## 2020-06-22 DIAGNOSIS — D869 Sarcoidosis, unspecified: Secondary | ICD-10-CM

## 2020-06-24 ENCOUNTER — Other Ambulatory Visit: Payer: Self-pay | Admitting: Pulmonary Disease

## 2020-06-24 DIAGNOSIS — R0789 Other chest pain: Secondary | ICD-10-CM

## 2020-07-06 ENCOUNTER — Ambulatory Visit (INDEPENDENT_AMBULATORY_CARE_PROVIDER_SITE_OTHER): Payer: Self-pay | Admitting: Pulmonary Disease

## 2020-07-06 ENCOUNTER — Other Ambulatory Visit: Payer: Self-pay

## 2020-07-06 ENCOUNTER — Encounter: Payer: Self-pay | Admitting: Pulmonary Disease

## 2020-07-06 VITALS — BP 124/80 | HR 78 | Temp 98.4°F | Ht 73.0 in | Wt 309.2 lb

## 2020-07-06 DIAGNOSIS — R0683 Snoring: Secondary | ICD-10-CM

## 2020-07-06 DIAGNOSIS — R0789 Other chest pain: Secondary | ICD-10-CM

## 2020-07-06 DIAGNOSIS — D869 Sarcoidosis, unspecified: Secondary | ICD-10-CM

## 2020-07-06 DIAGNOSIS — I517 Cardiomegaly: Secondary | ICD-10-CM

## 2020-07-06 NOTE — Progress Notes (Signed)
Synopsis: Former Company secretary patient with history of sarcoidosis  Subjective:   PATIENT ID: Paul M Vine GENDER: male DOB: 1981-04-14, MRN: [id number]   HPI  Chief Complaint  Patient presents with  . Establish Care    Establish care for ILD. Recently did a CT scan and echo back in December. Still having the chest discomfort on left side. Denies any changes with SOB.    Paul Thomas is a 40 year old male, never smoker with history of sarcoidosis diagnosed in 2019 via EBUS TBNA and GERD who returns to pulmonary clinic for follow up and has continued complaints of left sided chest discomfort.   He was seen by Elisha Headland, PA in our clinic on 05/26/20 where he reported constant left sided chest discomfort over the past year. The pain is deep inside his chest and can be worse after eating. Otherwise no other precipitating factors. Exertion does not make the pain worse. He denies cough, wheezing or shortness of breath. He maintains the same complaints today. He also complains of relfux disease and has been taking his pantoprazole about 5 out of 7 days per week without relief of his left sided chest discomfort.   He had an echo done on on 06/15/20 which show normal EF 60-65%, no wall motion abnormalities. Mild LVH. The RV is moderately to severely enlarged with preserved systolic function. Right atrium is moderately dilated.  HRCT Chest on 06/22/20 did not indicate signs of interstitial lung disease. There were signs of air trapping. There were prominent mediastinal lymph nodes but no lymphadenopathy present.    He is undergoing sleep evaluation via Dr. Edger House at [facility] and has a sleep study scheduled within the next month.   Past Medical History:  Diagnosis Date  . GERD (gastroesophageal reflux disease)   . Heart murmur     ????when in high school during SPORT physical- never saw a cardiologist (no problem)  . History of kidney stones      Family History   Problem Relation Age of Onset  . Heart attack Father      Social History   Socioeconomic History  . Marital status: Single    Spouse name: Not on file  . Number of children: Not on file  . Years of education: Not on file  . Highest education level: Not on file  Occupational History  . Not on file  Tobacco Use  . Smoking status: Never Smoker  . Smokeless tobacco: Never Used  Vaping Use  . Vaping Use: Never used  Substance and Sexual Activity  . Alcohol use: Not Currently    Comment: occ  . Drug use: Never  . Sexual activity: Not on file  Other Topics Concern  . Not on file  Social History Narrative  . Not on file   Social Determinants of Health   Financial Resource Strain: Not on file  Food Insecurity: Not on file  Transportation Needs: Not on file  Physical Activity: Not on file  Stress: Not on file  Social Connections: Not on file  Intimate Partner Violence: Not on file     No Known Allergies   Outpatient Medications Prior to Visit  Medication Sig Dispense Refill  . omeprazole (PRILOSEC OTC) 20 MG tablet Take 20 mg by mouth daily.     No facility-administered medications prior to visit.    Review of Systems  Constitutional: Negative for chills, fever, malaise/fatigue and weight loss.  HENT: Negative for congestion, sinus pain and  sore throat.   Eyes: Negative.   Respiratory: Negative for cough, hemoptysis, sputum production, shortness of breath and wheezing.   Cardiovascular: Positive for chest pain (left sided, constant, worse after eating). Negative for palpitations, orthopnea, claudication, leg swelling and PND.  Gastrointestinal: Positive for heartburn. Negative for abdominal pain, nausea and vomiting.  Genitourinary: Negative.   Musculoskeletal: Negative for joint pain and myalgias.  Skin: Negative for rash.  Neurological: Negative.   Endo/Heme/Allergies: Negative.   Psychiatric/Behavioral: Negative.     Objective:   Vitals:   07/06/20 0913   BP: 124/80  Pulse: 78  Temp: 98.4 F (36.9 C)  TempSrc: Temporal  SpO2: 99%  Weight: (!) 309 lb 3.2 oz (140.3 kg)  Height: 6\' 1"  (1.854 m)     Physical Exam Constitutional:      General: He is not in acute distress.    Appearance: Normal appearance. He is obese. He is not ill-appearing.  HENT:     Head: Normocephalic and atraumatic.  Eyes:     General: No scleral icterus.    Extraocular Movements: Extraocular movements intact.     Conjunctiva/sclera: Conjunctivae normal.  Cardiovascular:     Rate and Rhythm: Normal rate and regular rhythm.     Pulses: Normal pulses.     Heart sounds: Normal heart sounds. No murmur heard.   Pulmonary:     Effort: Pulmonary effort is normal.     Breath sounds: Normal breath sounds.  Abdominal:     General: Bowel sounds are normal.     Palpations: Abdomen is soft.  Musculoskeletal:     Right lower leg: No edema.     Left lower leg: No edema.  Lymphadenopathy:     Cervical: No cervical adenopathy.  Skin:    General: Skin is warm and dry.     Capillary Refill: Capillary refill takes less than 2 seconds.  Neurological:     General: No focal deficit present.     Mental Status: He is alert.  Psychiatric:        Mood and Affect: Mood normal.        Behavior: Behavior normal.        Thought Content: Thought content normal.        Judgment: Judgment normal.    CBC    Component Value Date/Time   WBC 8.0 08/06/2018 1847   RBC 5.13 08/06/2018 1847   HGB 13.1 08/06/2018 1847   HCT 42.6 08/06/2018 1847   PLT 359 08/06/2018 1847   MCV 83.0 08/06/2018 1847   MCH 25.5 (L) 08/06/2018 1847   MCHC 30.8 08/06/2018 1847   RDW 14.9 08/06/2018 1847   LYMPHSABS 0.6 (L) 08/06/2018 1847   MONOABS 1.0 08/06/2018 1847   EOSABS 0.1 08/06/2018 1847   BASOSABS 0.0 08/06/2018 1847   BMP Latest Ref Rng & Units 08/19/2018 08/06/2018 10/11/2017  Glucose 70 - 99 mg/dL 10/13/2017) 233(A) 076(A)  BUN 6 - 23 mg/dL 16 12 11   Creatinine 0.40 - 1.50 mg/dL 263(F  ) 3.54  Sodium 135 - 145 mEq/L 135 136 135  Potassium 3.5 - 5.1 mEq/L 4.0 3.9 3.8  Chloride 96 - 112 mEq/L 101 100 100(L)  CO2 19 - 32 mEq/L 26 26 24   Calcium 8.4 - 10.5 mg/dL 9.2 9.3 8.9   Chest imaging: High Res Chest CT 06/22/20 Mediastinum/Nodes: Multiple prominent but nonenlarged mediastinal and bilateral hilar lymph nodes are noted. Please note that accurate exclusion of hilar adenopathy is limited on noncontrast CT scans. Esophagus is  unremarkable in appearance. No axillary lymphadenopathy.  Lungs/Pleura: High-resolution images demonstrate no significant regions of ground-glass attenuation, septal thickening, subpleural reticulation, parenchymal banding, traction bronchiectasis or frank honeycombing to indicate interstitial lung disease. Inspiratory and expiratory imaging demonstrates some mild air trapping indicative of small airways disease. No acute consolidative airspace disease. No pleural effusions. No suspicious appearing pulmonary nodules or masses are noted.  PFT: PFT Results Latest Ref Rng & Units 12/24/2017  FVC-Pre L 3.67  FVC-Predicted Pre % 83  FVC-Post L 3.54  FVC-Predicted Post % 80  Pre FEV1/FVC % % 87  Post FEV1/FCV % % 91  FEV1-Pre L 3.20  FEV1-Predicted Pre % 88  FEV1-Post L 3.22  DLCO uncorrected ml/min/mmHg 28.98  DLCO UNC% % 89  DLVA Predicted % 133  TLC L 6.65  TLC % Predicted % 96  RV % Predicted % 185   Path: 2019 EBUS TBNA: RUL:  Station 7:  Echo: 1. The right ventricle is moderately-to-severely enlarged with preserved  systolic function. Unable to estimate PASP due to inadequate TR and PR  jets.  2. Left ventricular ejection fraction, by estimation, is 60 to 65%. The  left ventricle has normal function. The left ventricle has no regional  wall motion abnormalities. There is mild concentric left ventricular  hypertrophy. Left ventricular diastolic  parameters were normal.  3. Right atrial size was moderately dilated.   4. The mitral valve is abnormal. Trivial mitral valve regurgitation.  5. The aortic valve is tricuspid. Aortic valve regurgitation is not  visualized.  6. The inferior vena cava is normal in size with greater than 50%  respiratory variability, suggesting right atrial pressure of 3 mmHg.    Assessment & Plan:   Sarcoidosis  Atypical chest pain  Enlarged RV (right ventricle)  Snoring  Discussion: Tra Curd is a 40 year old male, never smoker with history of sarcoidosis diagnosed in 2019 via EBUS TBNA and GERD who returns to pulmonary clinic for follow up and has continued complaints of left sided chest discomfort.  He has an enlarged right ventricle based on echocardiogram but does not have signs of dyspnea or lower extremity edema. He is scheduled for a sleep study via [facility] and I stressed the importance of having this study done. I explained to him that untreated sleep apnea can lead to repeated stress on the heart and vascular system which could possibly explain his enlarged right heart.   I reviewed the CT chest scan with him and let him know that his lungs look good and there is no overt explanation for his left sided chest discomfort. I instructed him to take his omeprazole daily for GERD that could be causing his chest discomfort.   I encouraged him to work on further weight loss as obesity can contribute to sleep apnea and GERD.     I also instructed him to take aleve for 10 days and monitor for any improvement in the left chest discomfort.  He is to follow up in 3 months  Melody Comas, MD [facility] Office: [phone]   See Amion for Pager Details    Current Outpatient Medications:  .  omeprazole (PRILOSEC OTC) 20 MG tablet, Take 20 mg by mouth daily., Disp: , Rfl:

## 2020-07-06 NOTE — Patient Instructions (Signed)
Take Aleve as directed on the instructions on the bottle for 10 days.  Keep your sleep study appointment in February through [facility]  Take Prilosec every day for the next month.

## 2020-07-09 ENCOUNTER — Other Ambulatory Visit: Payer: Self-pay

## 2020-07-09 DIAGNOSIS — Z20822 Contact with and (suspected) exposure to covid-19: Secondary | ICD-10-CM

## 2020-07-11 LAB — NOVEL CORONAVIRUS, NAA: SARS-CoV-2, NAA: DETECTED — AB

## 2020-07-11 LAB — SARS-COV-2, NAA 2 DAY TAT

## 2020-07-26 ENCOUNTER — Ambulatory Visit: Payer: Self-pay | Admitting: Internal Medicine

## 2020-08-05 ENCOUNTER — Encounter: Payer: Self-pay | Admitting: General Practice

## 2020-08-18 ENCOUNTER — Telehealth: Payer: Self-pay | Admitting: Pulmonary Disease

## 2020-08-18 DIAGNOSIS — R0789 Other chest pain: Secondary | ICD-10-CM

## 2020-08-18 DIAGNOSIS — I517 Cardiomegaly: Secondary | ICD-10-CM

## 2020-08-18 NOTE — Telephone Encounter (Signed)
Yes, that is ok to place another cardiology referral.  Thanks, Cletis Athens

## 2020-08-18 NOTE — Telephone Encounter (Signed)
Called and spoke with patient who states that he needs another referral to cardiology that was sent by Elisha Headland, he was unable to make his appts.  Dr. Francine Graven you saw this patient 07/06/20 and there was a referral placed by Arlys John on 06/24/20 which has now been closed because patient was a no show. Are you ok with [place] placing another referral to Cardiology for Ref for Abn echo and atypical chest pain?

## 2020-08-19 NOTE — Telephone Encounter (Signed)
Called and spoke with pt letting him know that Dr. Francine Graven was fine with [place] placing another referral to cardiology. Pt verbalized understanding. Referral has been placed. Nothing further needed.

## 2020-08-19 NOTE — Telephone Encounter (Signed)
ATC pt. VM box has not been set up yet. WCB.  

## 2020-09-13 ENCOUNTER — Other Ambulatory Visit: Payer: Self-pay | Admitting: Internal Medicine

## 2020-09-13 ENCOUNTER — Encounter: Payer: Self-pay | Admitting: Internal Medicine

## 2020-09-13 ENCOUNTER — Ambulatory Visit: Payer: BLUE CROSS/BLUE SHIELD

## 2020-09-13 ENCOUNTER — Ambulatory Visit (INDEPENDENT_AMBULATORY_CARE_PROVIDER_SITE_OTHER): Payer: BLUE CROSS/BLUE SHIELD | Admitting: Internal Medicine

## 2020-09-13 ENCOUNTER — Telehealth: Payer: Self-pay | Admitting: Pulmonary Disease

## 2020-09-13 ENCOUNTER — Other Ambulatory Visit: Payer: Self-pay

## 2020-09-13 ENCOUNTER — Telehealth: Payer: Self-pay | Admitting: Internal Medicine

## 2020-09-13 ENCOUNTER — Encounter: Payer: Self-pay | Admitting: *Deleted

## 2020-09-13 VITALS — BP 132/82 | HR 76 | Ht 73.0 in | Wt 313.6 lb

## 2020-09-13 DIAGNOSIS — R072 Precordial pain: Secondary | ICD-10-CM | POA: Diagnosis not present

## 2020-09-13 DIAGNOSIS — J849 Interstitial pulmonary disease, unspecified: Secondary | ICD-10-CM | POA: Diagnosis not present

## 2020-09-13 DIAGNOSIS — R002 Palpitations: Secondary | ICD-10-CM

## 2020-09-13 DIAGNOSIS — G4733 Obstructive sleep apnea (adult) (pediatric): Secondary | ICD-10-CM | POA: Diagnosis not present

## 2020-09-13 DIAGNOSIS — D869 Sarcoidosis, unspecified: Secondary | ICD-10-CM

## 2020-09-13 NOTE — Patient Instructions (Signed)
Medication Instructions:  Your physician recommends that you continue on your current medications as directed. Please refer to the Current Medication list given to you today.  *If you need a refill on your cardiac medications before your next appointment, please call your pharmacy*   Lab Work: TODAY: CBC If you have labs (blood work) drawn today and your tests are completely normal, you will receive your results only by: Marland Kitchen MyChart Message (if you have MyChart) OR . A paper copy in the mail If you have any lab test that is abnormal or we need to change your treatment, we will call you to review the results.   Testing/Procedures: Your physician has requested that you have a Stress Cardiac MRI.  Your physician has requested that you wear a heart monitor.   Follow-Up: At [facility], you and your health needs are our priority.  As part of our continuing mission to provide you with exceptional heart care, we have created designated Provider Care Teams.  These Care Teams include your primary Cardiologist (physician) and Advanced Practice Providers (APPs -  Physician Assistants and Nurse Practitioners) who all work together to provide you with the care you need, when you need it.  We recommend signing up for the patient portal called "MyChart".  Sign up information is provided on this After Visit Summary.  MyChart is used to connect with patients for Virtual Visits (Telemedicine).  Patients are able to view lab/test results, encounter notes, upcoming appointments, etc.  Non-urgent messages can be sent to your provider as well.   To learn more about what you can do with MyChart, go to [email or web address].    Your next appointment:   3-5 month(s)  The format for your next appointment:   In Person  Provider:   You may see Christell Constant, MD or one of the following Advanced Practice Providers on your designated Care Team:    Ronie Spies, PA-C  Jacolyn Reedy, PA-C    Other  Instructions Paul Thomas- Long Term Monitor Instructions   Your physician has requested you wear your ZIO patch monitor_14_days.   This is a single patch monitor.  [employer] supplies one patch monitor per enrollment.  Additional stickers are not available.   Please do not apply patch if you will be having a Nuclear Stress Test, Echocardiogram, Cardiac CT, MRI, or Chest Xray during the time frame you would be wearing the monitor. The patch cannot be worn during these tests.  You cannot remove and re-apply the ZIO XT patch monitor.   Your ZIO patch monitor will be sent USPS Priority mail from [facility] directly to your home address. The monitor may also be mailed to a PO BOX if home delivery is not available.   It may take 3-5 days to receive your monitor after you have been enrolled.   Once you have received you monitor, please review enclosed instructions.  Your monitor has already been registered assigning a specific monitor serial # to you.   Applying the monitor   Shave hair from upper left chest.   Hold abrader disc by orange tab.  Rub abrader in 40 strokes over left upper chest as indicated in your monitor instructions.   Clean area with 4 enclosed alcohol pads .  Use all pads to assure are is cleaned thoroughly.  Let dry.   Apply patch as indicated in monitor instructions.  Patch will be place under collarbone on left side of chest with arrow pointing upward.   Rub  patch adhesive wings for 2 minutes.Remove white label marked "1".  Remove white label marked "2".  Rub patch adhesive wings for 2 additional minutes.   While looking in a mirror, press and release button in center of patch.  A small green light will flash 3-4 times .  This will be your only indicator the monitor has been turned on.     Do not shower for the first 24 hours.  You may shower after the first 24 hours.   Press button if you feel a symptom. You will hear a small click.  Record Date, Time and Symptom in the  Patient Log Book.   When you are ready to remove patch, follow instructions on last 2 pages of Patient Log Book.  Stick patch monitor onto last page of Patient Log Book.   Place Patient Log Book in [place] box.  Use locking tab on box and tape box closed securely.  The [employer] and [employer] has [employer] on it.  Please place in mailbox as soon as possible.  Your physician should have your test results approximately 7 days after the monitor has been mailed back to [facility].   Call [facility] Customer Care at [phone] if you have questions regarding your ZIO XT patch monitor.  Call them immediately if you see an orange light blinking on your monitor.   If your monitor falls off in less than 4 days contact our Monitor department at [phone].  If your monitor becomes loose or falls off after 4 days call [employer] at [phone] for suggestions on securing your monitor.

## 2020-09-13 NOTE — Telephone Encounter (Signed)
Called and spoke with patient. He wanted to know if he still needed to go to his cardiology appt this morning. I advised him that he still needs to go despite not having his cpap machine. I gave him the address.   Nothing further needed at time of call.

## 2020-09-13 NOTE — Telephone Encounter (Signed)
Called to discuss preferred weekdays and times for scheduling the MR Cardiac stress order by Dr. Izora Ribas.  Informed patient as soon as we  Hear regarding the insurance prior authorization I will be in touch with his appointment .  Patient voiced his understandingt

## 2020-09-13 NOTE — Progress Notes (Addendum)
Cardiology Office Note:    Date:  09/13/2020   ID:  Paul Thomas, DOB April 13, 1981, MRN [id number]  PCP:  Genevie Ann   [facility]  Cardiologist:  Christell Constant, MD  Advanced Practice Provider:  No care team member to display Electrophysiologist:  None       CC: Chest Pain  Consulted for the evaluation of chest pain at the behest of Verlee Rossetti, PA-C  History of Present Illness:    Paul Thomas is a 40 y.o. male with a hx of Morbid Obesity OSA awaiting CP, Prior concern for ILD (Addend, discussed with patient Pulmonologist there is no evidence of ILD), Sarcoidosis who presents for evaluation 09/13/20.  Patient notes that he is feeling alright other than his chest pain. Feels like there is something in his chest that isn't supposed to be there.   Discomfort occurs spontaneously and about once a month, worsens independent of activity, and improves with Advil for 15 days and lost some weight:  With weight loss CP improved..  Patient exertion notable for walking  and feels no symptoms.  No shortness of breath at rest; but notes DOE.   Notes no PND or orthopnea. No syncope or near syncope . Notes  palpitations or funny heart beats.   No claustrophobia  Past Medical History:  Diagnosis Date  . Allergic rhinitis   . Chest pain   . Chronic pansinusitis   . Elevated blood pressure reading without diagnosis of hypertension   . GERD (gastroesophageal reflux disease)   . Heart murmur     ????when in high school during SPORT physical- never saw a cardiologist (no problem)  . History of kidney stones   . Hyperlipidemia   . Hyperuricemia   . ILD (interstitial lung disease) (HCC)   . Mediastinal lymphadenopathy   . Morbid obesity (HCC)   . OSA (obstructive sleep apnea)   . Prediabetes   . Sarcoidosis     Past Surgical History:  Procedure Laterality Date  . LUNG BIOPSY    . VIDEO BRONCHOSCOPY WITH ENDOBRONCHIAL ULTRASOUND N/A  10/23/2017   Procedure: VIDEO BRONCHOSCOPY WITH ENDOBRONCHIAL ULTRASOUND;  Surgeon: Lupita Leash, MD;  Location: [facility] OR;  Service: Thoracic;  Laterality: N/A;    Current Medications: Current Meds  Medication Sig  . omeprazole (PRILOSEC OTC) 20 MG tablet Take 20 mg by mouth daily.     Allergies:   Patient has no known allergies.   Social History   Socioeconomic History  . Marital status: Single    Spouse name: Not on file  . Number of children: Not on file  . Years of education: Not on file  . Highest education level: Not on file  Occupational History  . Not on file  Tobacco Use  . Smoking status: Never Smoker  . Smokeless tobacco: Never Used  Vaping Use  . Vaping Use: Never used  Substance and Sexual Activity  . Alcohol use: Not Currently    Comment: occ  . Drug use: Never  . Sexual activity: Not on file  Other Topics Concern  . Not on file  Social History Narrative  . Not on file   Social Determinants of Health   Financial Resource Strain: Not on file  Food Insecurity: Not on file  Transportation Needs: Not on file  Physical Activity: Not on file  Stress: Not on file  Social Connections: Not on file     Family History: The patient's family  history includes Cancer in his father; Diabetes in his father; Heart attack in his father; Hypertension in his father. History of coronary artery disease notable for father when he was 53. History of heart failure notable for no members. History of arrhythmia notable for no members. Denies family history of sudden cardiac death including drowning, car accidents, or unexplained deaths in the family. No history of bicuspid aortic valve or aortic aneurysm or dissection.   No history of cardiomyopathies including hypertrophic cardiomyopathy, left ventricular non-compaction, or arrhythmogenic right ventricular cardiomyopathy.  ROS:   Please see the history of present illness.     All other systems reviewed and are  negative.  EKGs/Labs/Other Studies Reviewed:    The following studies were reviewed today:  EKG:   09/13/20: Sr 76 WNL   Transthoracic Echocardiogram: Date: 06/15/20 Results: IMPRESSIONS  1. The right ventricle is moderately-to-severely enlarged with preserved  systolic function. Unable to estimate PASP due to inadequate TR and PR  jets.  2. Left ventricular ejection fraction, by estimation, is 60 to 65%. The  left ventricle has normal function. The left ventricle has no regional  wall motion abnormalities. There is mild concentric left ventricular  hypertrophy. Left ventricular diastolic  parameters were normal.  3. Right atrial size was moderately dilated.  4. The mitral valve is abnormal. Trivial mitral valve regurgitation.  5. The aortic valve is tricuspid. Aortic valve regurgitation is not  visualized.  6. The inferior vena cava is normal in size with greater than 50%  respiratory variability, suggesting right atrial pressure of 3 mmHg.   NonCardiac CT : Date:09/13/20 Results: IMPRESSION: 1. No imaging findings to suggest interstitial lung disease. 2. Multiple prominent but nonenlarged mediastinal and bilateral hilar lymph nodes are highly nonspecific. No other imaging stigmata of sarcoidosis. 3. Mild air trapping indicative of small airways disease.   Recent Labs: No results found for requested labs within last 8760 hours.  Recent Lipid Panel No results found for: CHOL, TRIG, HDL, CHOLHDL, VLDL, LDLCALC, LDLDIRECT   Risk Assessment/Calculations:     N/A  Physical Exam:    VS:  BP 132/82   Pulse 76   Ht 6\' 1"  (1.854 m)   Wt (!) 313 lb 9.6 oz (142.2 kg)   SpO2 97%   BMI 41.37 kg/m     Wt Readings from Last 3 Encounters:  09/13/20 (!) 313 lb 9.6 oz (142.2 kg)  07/06/20 (!) 309 lb 3.2 oz (140.3 kg)  05/26/20 (!) 314 lb (142.4 kg)    GEN:  Obese well developed in no acute distress HEENT: Normal NECK: No JVD; No carotid bruits LYMPHATICS: No  lymphadenopathy CARDIAC: RRR, no murmurs, rubs, gallops but distant heart sounds RESPIRATORY:  Clear to auscultation without rales, wheezing or rhonchi  ABDOMEN: Soft, non-tender, non-distended MUSCULOSKELETAL:  No edema; No deformity  SKIN: Warm and dry NEUROLOGIC:  Alert and oriented x 3 PSYCHIATRIC:  Normal affect   Measured at under 70 cm wide at maximal width (tape measure)  ASSESSMENT:    1. Sarcoidosis   2. Precordial pain   3. OSA (obstructive sleep apnea)   4. ILD (interstitial lung disease) (HCC)    PLAN:    In order of problems listed above:  Pre-cordial CP Morbid Obesity OSA awaiting CP ILD - The patient presents with possible cardiac CP - given this and his Sarcoidosis and his RV enlargement, and Stress MRI could evaluate all of these without additional testing - Would recommend pharmacological CMR stress test (NPO at midnight);  discussed risks, benefits, and alternatives of the diagnostic procedure including chest pain, arrhythmia, and death.  Patient amenable for testing.  Cardiac Sarcoidosis Eval - With/ extracardiac sarcoidosis (lung) - With negative biopsy 10/23/2017 - without presyncope, or syncope. - without complete left or right bundle branch block; presence of unexplained pathologic Q waves in two or more leads; sustained first-, second-, or third-degree AV block; or sustained or nonsustained VT - will get 14 day non-live ZioPatch - CMR as above (will also need CBC)  3-5 months follow up unless new symptoms or abnormal test results warranting change in plan  Would be reasonable for  Video Visit Follow up Would be reasonable for  APP Follow up      Medication Adjustments/Labs and Tests Ordered: Current medicines are reviewed at length with the patient today.  Concerns regarding medicines are outlined above.  Orders Placed This Encounter  Procedures  . MR CARDIAC STRESS TEST  . CBC  . EKG 12-Lead   No orders of the defined types were placed in  this encounter.   Patient Instructions  Medication Instructions:  Your physician recommends that you continue on your current medications as directed. Please refer to the Current Medication list given to you today.  *If you need a refill on your cardiac medications before your next appointment, please call your pharmacy*   Lab Work: TODAY: CBC If you have labs (blood work) drawn today and your tests are completely normal, you will receive your results only by: Marland Kitchen. MyChart Message (if you have MyChart) OR . A paper copy in the mail If you have any lab test that is abnormal or we need to change your treatment, we will call you to review the results.   Testing/Procedures: Your physician has requested that you have a Stress Cardiac MRI.  Your physician has requested that you wear a heart monitor.   Follow-Up: At [facility], you and your health needs are our priority.  As part of our continuing mission to provide you with exceptional heart care, we have created designated Provider Care Teams.  These Care Teams include your primary Cardiologist (physician) and Advanced Practice Providers (APPs -  Physician Assistants and Nurse Practitioners) who all work together to provide you with the care you need, when you need it.  We recommend signing up for the patient portal called "MyChart".  Sign up information is provided on this After Visit Summary.  MyChart is used to connect with patients for Virtual Visits (Telemedicine).  Patients are able to view lab/test results, encounter notes, upcoming appointments, etc.  Non-urgent messages can be sent to your provider as well.   To learn more about what you can do with MyChart, go to [email or web address].    Your next appointment:   3-5 month(s)  The format for your next appointment:   In Person  Provider:   You may see Christell ConstantMahesh A Jaunita Mikels, MD or one of the following Advanced Practice Providers on your designated Care Team:    Ronie Spiesayna  Dunn, PA-C  Jacolyn ReedyMichele Lenze, PA-C    Other Instructions Christena DeemZIO XT- Long Term Monitor Instructions   Your physician has requested you wear your ZIO patch monitor_14_days.   This is a single patch monitor.  [employer] supplies one patch monitor per enrollment.  Additional stickers are not available.   Please do not apply patch if you will be having a Nuclear Stress Test, Echocardiogram, Cardiac CT, MRI, or Chest Xray during the time frame you would be wearing the monitor.  The patch cannot be worn during these tests.  You cannot remove and re-apply the ZIO XT patch monitor.   Your ZIO patch monitor will be sent USPS Priority mail from [facility] directly to your home address. The monitor may also be mailed to a PO BOX if home delivery is not available.   It may take 3-5 days to receive your monitor after you have been enrolled.   Once you have received you monitor, please review enclosed instructions.  Your monitor has already been registered assigning a specific monitor serial # to you.   Applying the monitor   Shave hair from upper left chest.   Hold abrader disc by orange tab.  Rub abrader in 40 strokes over left upper chest as indicated in your monitor instructions.   Clean area with 4 enclosed alcohol pads .  Use all pads to assure are is cleaned thoroughly.  Let dry.   Apply patch as indicated in monitor instructions.  Patch will be place under collarbone on left side of chest with arrow pointing upward.   Rub patch adhesive wings for 2 minutes.Remove white label marked "1".  Remove white label marked "2".  Rub patch adhesive wings for 2 additional minutes.   While looking in a mirror, press and release button in center of patch.  A small green light will flash 3-4 times .  This will be your only indicator the monitor has been turned on.     Do not shower for the first 24 hours.  You may shower after the first 24 hours.   Press button if you feel a symptom. You will hear a  small click.  Record Date, Time and Symptom in the Patient Log Book.   When you are ready to remove patch, follow instructions on last 2 pages of Patient Log Book.  Stick patch monitor onto last page of Patient Log Book.   Place Patient Log Book in [place] box.  Use locking tab on box and tape box closed securely.  The [employer] and [employer] has [employer] on it.  Please place in mailbox as soon as possible.  Your physician should have your test results approximately 7 days after the monitor has been mailed back to [facility].   Call [facility] Customer Care at [phone] if you have questions regarding your ZIO XT patch monitor.  Call them immediately if you see an orange light blinking on your monitor.   If your monitor falls off in less than 4 days contact our Monitor department at [phone].  If your monitor becomes loose or falls off after 4 days call [employer] at [phone] for suggestions on securing your monitor.       Signed, Christell Constant, MD  09/13/2020 1:09 PM     [facility]

## 2020-09-13 NOTE — Progress Notes (Signed)
Patient ID: Paul Thomas, male   DOB: 1981-04-05, 40 y.o.   MRN: [id number] Patient enrolled for [employer] to ship a 14 day ZIO XT long term holter monitor to his home.

## 2020-09-14 LAB — CBC
Hematocrit: 39.7 % (ref 37.5–51.0)
Hemoglobin: 13.4 g/dL (ref 13.0–17.7)
MCH: 27.9 pg (ref 26.6–33.0)
MCHC: 33.8 g/dL (ref 31.5–35.7)
MCV: 83 fL (ref 79–97)
Platelets: 339 10*3/uL (ref 150–450)
RBC: 4.8 x10E6/uL (ref 4.14–5.80)
RDW: 13.9 % (ref 11.6–15.4)
WBC: 5 10*3/uL (ref 3.4–10.8)

## 2020-09-21 ENCOUNTER — Encounter: Payer: Self-pay | Admitting: Internal Medicine

## 2020-09-21 NOTE — Telephone Encounter (Signed)
Spoke with patient regarding the Monday 11/01/20 2:00pm Cardiac Stress appointment at [facility]---arrival time is 1:00pm---1st floor admissions office check in.  Will mail information to patient and it is availabe in My Chart.  Patient voiced his understanding.

## 2020-10-27 ENCOUNTER — Other Ambulatory Visit (HOSPITAL_COMMUNITY): Payer: Self-pay | Admitting: *Deleted

## 2020-10-27 DIAGNOSIS — R002 Palpitations: Secondary | ICD-10-CM

## 2020-10-28 ENCOUNTER — Telehealth: Payer: Self-pay | Admitting: Internal Medicine

## 2020-10-28 NOTE — Telephone Encounter (Signed)
Called to inform patient we were cancelling the Cardiac Stress test on Monday 11/01/20 at 1:00pm (early arrival for EKG) due to pending insurance prior authorization.  There was no answer and no voice mail set up.  Will continue to try and reach patient via phone.

## 2020-10-29 ENCOUNTER — Telehealth (HOSPITAL_COMMUNITY): Payer: Self-pay | Admitting: *Deleted

## 2020-10-29 NOTE — Telephone Encounter (Signed)
Reaching out to patient to offer assistance regarding upcoming cardiac imaging study; pt verbalizes understanding of appt date/time, parking situation and where to check in,and verified current allergies; name and call back number provided for further questions should they arise  Larey Brick RN Navigator Cardiac Imaging [facility] [phone] office [phone] cell  Pt aware of 1:15pm EKG appointment prior to MRI stress test.

## 2020-11-01 ENCOUNTER — Ambulatory Visit (HOSPITAL_COMMUNITY)
Admission: RE | Admit: 2020-11-01 | Discharge: 2020-11-01 | Disposition: A | Discharge status: Home / Self Care | Payer: BLUE CROSS/BLUE SHIELD | Source: Ambulatory Visit | Attending: Internal Medicine | Admitting: Internal Medicine

## 2020-11-01 ENCOUNTER — Other Ambulatory Visit: Payer: Self-pay

## 2020-11-01 ENCOUNTER — Encounter: Payer: Self-pay | Admitting: Cardiology

## 2020-11-01 ENCOUNTER — Ambulatory Visit (HOSPITAL_COMMUNITY)
Admission: RE | Admit: 2020-11-01 | Discharge: 2020-11-01 | Disposition: A | Discharge status: Home / Self Care | Payer: BLUE CROSS/BLUE SHIELD | Source: Ambulatory Visit | Attending: Cardiology | Admitting: Cardiology

## 2020-11-01 DIAGNOSIS — R072 Precordial pain: Secondary | ICD-10-CM

## 2020-11-01 DIAGNOSIS — R002 Palpitations: Secondary | ICD-10-CM | POA: Insufficient documentation

## 2020-11-01 MED ORDER — GADOBUTROL 1 MMOL/ML IV SOLN
10.0000 mL | Freq: Once | INTRAVENOUS | Status: AC | PRN
  Administered 2020-11-01: 10 mL via INTRAVENOUS

## 2020-11-01 MED ORDER — ALBUTEROL SULFATE HFA 108 (90 BASE) MCG/ACT IN AERS
INHALATION_SPRAY | RESPIRATORY_TRACT | Status: AC
  Filled 2020-11-01: qty 6.7

## 2020-11-01 MED ORDER — AMINOPHYLLINE 25 MG/ML IV SOLN
INTRAVENOUS | Status: AC
  Filled 2020-11-01: qty 10

## 2020-11-01 MED ORDER — REGADENOSON 0.4 MG/5ML IV SOLN
INTRAVENOUS | Status: AC
  Administered 2020-11-01: 0.4 mg via INTRAVENOUS
  Filled 2020-11-01: qty 5

## 2020-11-01 MED ORDER — NITROGLYCERIN 0.4 MG SL SUBL
SUBLINGUAL_TABLET | SUBLINGUAL | Status: AC
  Filled 2020-11-01: qty 1

## 2020-11-01 MED ORDER — METOPROLOL TARTRATE 5 MG/5ML IV SOLN
INTRAVENOUS | Status: AC
  Filled 2020-11-01: qty 5

## 2020-11-01 MED ORDER — REGADENOSON 0.4 MG/5ML IV SOLN
0.4000 mg | Freq: Once | INTRAVENOUS | Status: AC
  Filled 2020-11-01: qty 5

## 2020-11-01 NOTE — Progress Notes (Signed)
At time of lexiscan administration.  Pt denies SOB or chest pain. Dr. Bjorn Pippin at scanner.

## 2020-11-01 NOTE — Progress Notes (Signed)
Post lexiscan administration. Pt denies SOB or chest pain. Dr. Bjorn Pippin at bedside.

## 2020-11-01 NOTE — Progress Notes (Signed)
Patient presents today for stress MRI.  EKG today shows NSR, rate 68.  BP 124/95. Lungs CTAB.    Shared Decision Making/Informed Consent The risks [chest pain, shortness of breath, cardiac arrhythmias, dizziness, blood pressure fluctuations, myocardial infarction, stroke/transient ischemic attack, nausea, vomiting, allergic reaction and life-threatening complications (estimated to be 1 in 10,000)], benefits (risk stratification, diagnosing coronary artery disease, treatment guidance) and alternatives of an MRI stress test were discussed in detail with Paul Thomas and he agrees to proceed.

## 2021-02-10 ENCOUNTER — Encounter: Payer: Self-pay | Admitting: *Deleted

## 2021-02-10 NOTE — Progress Notes (Signed)
Patient ID: Paul Thomas, male   DOB: 1981-03-19, 40 y.o.   MRN: [id number] ZIO patch monitor has not been returned to [employer] to be processed and enrollment has been changed to status Lost.  [employer] has attempted to contact patient three times to request return of monitor.  Order has been cancelled.

## 2021-02-20 NOTE — Progress Notes (Deleted)
Cardiology Office Note:    Date:  02/20/2021   ID:  Paul Thomas, DOB [birth year], MRN [id number]  PCP:  Genevie Ann    [facility]  Cardiologist:  Christell Constant, MD  Advanced Practice Provider:  No care team member to display Electrophysiologist:  None       CC: Chest Pain f/u   History of Present Illness:    Paul Thomas is a 40 y.o. male with a hx of Morbid Obesity OSA awaiting CP, Prior concern for ILD (Addend, discussed with patient Pulmonologist there is no evidence of ILD), Sarcoidosis who presents for evaluation 09/13/20.  Negative Stress MRI with normal RV function and no evidence of cardiac  sarcoidosis.  Patient notes that he is doing ***.  Since day prior/last visit notes *** changes.  Relevant interval testing or therapy include ***.  There are no*** interval hospital/ED visit.    No chest pain or pressure ***.  No SOB/DOE*** and no PND/Orthopnea***.  No weight gain or leg swelling***.  No palpitations or syncope ***.  Ambulatory blood pressure ***.    Past Medical History:  Diagnosis Date   Allergic rhinitis    Chest pain    Chronic pansinusitis    Elevated blood pressure reading without diagnosis of hypertension    GERD (gastroesophageal reflux disease)    Heart murmur     ????when in high school during SPORT physical- never saw a cardiologist (no problem)   History of kidney stones    Hyperlipidemia    Hyperuricemia    ILD (interstitial lung disease) (HCC)    Mediastinal lymphadenopathy    Morbid obesity (HCC)    OSA (obstructive sleep apnea)    Prediabetes    Sarcoidosis     Past Surgical History:  Procedure Laterality Date   LUNG BIOPSY     VIDEO BRONCHOSCOPY WITH ENDOBRONCHIAL ULTRASOUND N/A 10/23/2017   Procedure: VIDEO BRONCHOSCOPY WITH ENDOBRONCHIAL ULTRASOUND;  Surgeon: Lupita Leash, MD;  Location: [facility] OR;  Service: Thoracic;  Laterality: N/A;    Current Medications: No outpatient  medications have been marked as taking for the 02/21/21 encounter (Appointment) with Christell Constant, MD.     Allergies:   Patient has no known allergies.   Social History   Socioeconomic History   Marital status: Single    Spouse name: Not on file   Number of children: Not on file   Years of education: Not on file   Highest education level: Not on file  Occupational History   Not on file  Tobacco Use   Smoking status: Never   Smokeless tobacco: Never  Vaping Use   Vaping Use: Never used  Substance and Sexual Activity   Alcohol use: Not Currently    Comment: occ   Drug use: Never   Sexual activity: Not on file  Other Topics Concern   Not on file  Social History Narrative   Not on file   Social Determinants of Health   Financial Resource Strain: Not on file  Food Insecurity: Not on file  Transportation Needs: Not on file  Physical Activity: Not on file  Stress: Not on file  Social Connections: Not on file     Family History: The patient's family history includes Cancer in his father; Diabetes in his father; Heart attack in his father; Hypertension in his father. History of coronary artery disease notable for father when he was 68. History of heart failure notable for no members.  History of arrhythmia notable for no members. Denies family history of sudden cardiac death including drowning, car accidents, or unexplained deaths in the family. No history of bicuspid aortic valve or aortic aneurysm or dissection.   No history of cardiomyopathies including hypertrophic cardiomyopathy, left ventricular non-compaction, or arrhythmogenic right ventricular cardiomyopathy.  ROS:   Please see the history of present illness.     All other systems reviewed and are negative.  EKGs/Labs/Other Studies Reviewed:    The following studies were reviewed today:  EKG:   09/13/20: Sr 76 WNL   Transthoracic Echocardiogram: Date: 06/15/20 Results: IMPRESSIONS   1. The right  ventricle is moderately-to-severely enlarged with preserved  systolic function. Unable to estimate PASP due to inadequate TR and PR  jets.   2. Left ventricular ejection fraction, by estimation, is 60 to 65%. The  left ventricle has normal function. The left ventricle has no regional  wall motion abnormalities. There is mild concentric left ventricular  hypertrophy. Left ventricular diastolic  parameters were normal.   3. Right atrial size was moderately dilated.   4. The mitral valve is abnormal. Trivial mitral valve regurgitation.   5. The aortic valve is tricuspid. Aortic valve regurgitation is not  visualized.   6. The inferior vena cava is normal in size with greater than 50%  respiratory variability, suggesting right atrial pressure of 3 mmHg.   NonCardiac CT : Date:09/13/20 Results: IMPRESSION: 1. No imaging findings to suggest interstitial lung disease. 2. Multiple prominent but nonenlarged mediastinal and bilateral hilar lymph nodes are highly nonspecific. No other imaging stigmata of sarcoidosis. 3. Mild air trapping indicative of small airways disease.  Stress MRI Date: 11/01/20 Results: IMPRESSION: 1.  No evidence of ischemia on stress perfusion imaging   2.  No late gadolinium enhancement to suggest myocardial scar   3. Normal LV size, mild hypertrophy, and normal systolic function (EF 61%)   4.  Normal RV size and systolic function (EF 57%)    Recent Labs: 09/13/2020: Hemoglobin 13.4; Platelets 339  Recent Lipid Panel No results found for: CHOL, TRIG, HDL, CHOLHDL, VLDL, LDLCALC, LDLDIRECT   Risk Assessment/Calculations:     N/A  Physical Exam:    VS:  There were no vitals taken for this visit.    Wt Readings from Last 3 Encounters:  09/13/20 (!) 313 lb 9.6 oz (142.2 kg)  07/06/20 (!) 309 lb 3.2 oz (140.3 kg)  05/26/20 (!) 314 lb (142.4 kg)    GEN:  Obese well developed in no acute distress HEENT: Normal NECK: No JVD; No carotid  bruits LYMPHATICS: No lymphadenopathy CARDIAC: RRR, no murmurs, rubs, gallops but distant heart sounds RESPIRATORY:  Clear to auscultation without rales, wheezing or rhonchi  ABDOMEN: Soft, non-tender, non-distended MUSCULOSKELETAL:  No edema; No deformity  SKIN: Warm and dry NEUROLOGIC:  Alert and oriented x 3 PSYCHIATRIC:  Normal affect   Measured at under 70 cm wide at maximal width (tape measure)  ASSESSMENT:    No diagnosis found.  PLAN:    In order of problems listed above:  Pre-cordial CP Morbid Obesity OSA awaiting CP ILD - negative CMR  Cardiac Sarcoidosis Eval - With/ extracardiac sarcoidosis (lung) - With negative biopsy 10/23/2017 - with negative CMR  *** follow up unless new symptoms or abnormal test results warranting change in plan  Would be reasonable for *** Video Visit Follow up  Would be reasonable for *** APP Follow up        Medication Adjustments/Labs and Tests Ordered:  Current medicines are reviewed at length with the patient today.  Concerns regarding medicines are outlined above.  No orders of the defined types were placed in this encounter.  No orders of the defined types were placed in this encounter.   There are no Patient Instructions on file for this visit.   Signed, Christell Constant, MD  02/20/2021 2:15 PM    [facility]

## 2021-02-21 ENCOUNTER — Ambulatory Visit: Payer: BLUE CROSS/BLUE SHIELD | Admitting: Internal Medicine
